# Patient Record
Sex: Male | Born: 1980 | Race: White | Hispanic: No | Marital: Married | Smoking: Never smoker
Health system: Southern US, Community
[De-identification: ages and names within clinical notes are randomized; demographics above are authoritative.]

## PROBLEM LIST (undated history)

## (undated) DIAGNOSIS — K4090 Unilateral inguinal hernia, without obstruction or gangrene, not specified as recurrent: Secondary | ICD-10-CM

## (undated) DIAGNOSIS — T4145XA Adverse effect of unspecified anesthetic, initial encounter: Secondary | ICD-10-CM

---

## 2012-02-08 HISTORY — PX: MULTIPLE TOOTH EXTRACTIONS: SHX2053

## 2012-09-04 ENCOUNTER — Other Ambulatory Visit: Payer: Self-pay | Admitting: Physician Assistant

## 2012-09-04 DIAGNOSIS — R945 Abnormal results of liver function studies: Secondary | ICD-10-CM

## 2012-09-05 ENCOUNTER — Other Ambulatory Visit: Payer: Self-pay | Admitting: Physician Assistant

## 2012-09-05 ENCOUNTER — Ambulatory Visit
Admission: RE | Admit: 2012-09-05 | Discharge: 2012-09-05 | Disposition: A | Discharge status: Home / Self Care | Payer: Self-pay | Source: Ambulatory Visit | Attending: Physician Assistant | Admitting: Physician Assistant

## 2012-09-05 ENCOUNTER — Ambulatory Visit
Admission: RE | Admit: 2012-09-05 | Discharge: 2012-09-05 | Disposition: A | Discharge status: Home / Self Care | Payer: BC Managed Care – PPO | Source: Ambulatory Visit | Attending: Physician Assistant | Admitting: Physician Assistant

## 2012-09-05 ENCOUNTER — Other Ambulatory Visit: Payer: Self-pay | Admitting: Gastroenterology

## 2012-09-05 DIAGNOSIS — R945 Abnormal results of liver function studies: Secondary | ICD-10-CM

## 2012-09-05 MED ORDER — IOHEXOL 350 MG/ML SOLN
125.0000 mL | Freq: Once | INTRAVENOUS | Status: AC | PRN
  Administered 2012-09-05: 100 mL via INTRAVENOUS

## 2012-09-06 ENCOUNTER — Other Ambulatory Visit: Payer: Self-pay | Admitting: Gastroenterology

## 2012-09-06 ENCOUNTER — Encounter (HOSPITAL_COMMUNITY)
Admission: RE | Disposition: A | Discharge status: Home / Self Care | Payer: Self-pay | Source: Ambulatory Visit | Attending: Gastroenterology

## 2012-09-06 ENCOUNTER — Ambulatory Visit (HOSPITAL_COMMUNITY)
Admission: RE | Admit: 2012-09-06 | Discharge: 2012-09-06 | Disposition: A | Discharge status: Home / Self Care | Payer: BC Managed Care – PPO | Source: Ambulatory Visit | Attending: Gastroenterology | Admitting: Gastroenterology

## 2012-09-06 ENCOUNTER — Ambulatory Visit (HOSPITAL_COMMUNITY): Payer: BC Managed Care – PPO | Admitting: Anesthesiology

## 2012-09-06 ENCOUNTER — Ambulatory Visit (HOSPITAL_COMMUNITY): Payer: BC Managed Care – PPO

## 2012-09-06 ENCOUNTER — Encounter (HOSPITAL_COMMUNITY): Payer: Self-pay | Admitting: *Deleted

## 2012-09-06 ENCOUNTER — Encounter (HOSPITAL_COMMUNITY): Payer: Self-pay | Admitting: Anesthesiology

## 2012-09-06 DIAGNOSIS — T8859XA Other complications of anesthesia, initial encounter: Secondary | ICD-10-CM

## 2012-09-06 DIAGNOSIS — K8051 Calculus of bile duct without cholangitis or cholecystitis with obstruction: Secondary | ICD-10-CM | POA: Insufficient documentation

## 2012-09-06 DIAGNOSIS — K8020 Calculus of gallbladder without cholecystitis without obstruction: Secondary | ICD-10-CM | POA: Insufficient documentation

## 2012-09-06 HISTORY — DX: Other complications of anesthesia, initial encounter: T88.59XA

## 2012-09-06 HISTORY — PX: EUS: SHX5427

## 2012-09-06 HISTORY — PX: ERCP: SHX5425

## 2012-09-06 SURGERY — UPPER ENDOSCOPIC ULTRASOUND (EUS) LINEAR
Anesthesia: General

## 2012-09-06 MED ORDER — LIDOCAINE HCL (CARDIAC) 20 MG/ML IV SOLN
INTRAVENOUS | Status: DC | PRN
  Administered 2012-09-06: 100 mg via INTRAVENOUS

## 2012-09-06 MED ORDER — KETAMINE HCL 10 MG/ML IJ SOLN
INTRAMUSCULAR | Status: DC | PRN
  Administered 2012-09-06: 40 mg via INTRAVENOUS

## 2012-09-06 MED ORDER — LACTATED RINGERS IV SOLN
INTRAVENOUS | Status: DC
  Administered 2012-09-06: 1000 mL via INTRAVENOUS

## 2012-09-06 MED ORDER — BUTAMBEN-TETRACAINE-BENZOCAINE 2-2-14 % EX AERO
INHALATION_SPRAY | CUTANEOUS | Status: DC | PRN
  Administered 2012-09-06: 2 via TOPICAL

## 2012-09-06 MED ORDER — CIPROFLOXACIN IN D5W 400 MG/200ML IV SOLN
400.0000 mg | Freq: Once | INTRAVENOUS | Status: AC
  Administered 2012-09-06: 400 mg via INTRAVENOUS

## 2012-09-06 MED ORDER — CIPROFLOXACIN IN D5W 400 MG/200ML IV SOLN
INTRAVENOUS | Status: AC
  Filled 2012-09-06: qty 200

## 2012-09-06 MED ORDER — PROPOFOL 10 MG/ML IV EMUL
INTRAVENOUS | Status: DC | PRN
  Administered 2012-09-06: 160 ug/kg/min via INTRAVENOUS

## 2012-09-06 MED ORDER — ONDANSETRON HCL 4 MG/2ML IJ SOLN
INTRAMUSCULAR | Status: DC | PRN
  Administered 2012-09-06: 4 mg via INTRAVENOUS

## 2012-09-06 MED ORDER — SUCCINYLCHOLINE CHLORIDE 20 MG/ML IJ SOLN
INTRAMUSCULAR | Status: DC | PRN
  Administered 2012-09-06: 100 mg via INTRAVENOUS

## 2012-09-06 MED ORDER — SODIUM CHLORIDE 0.9 % IV SOLN: INTRAVENOUS | Status: DC

## 2012-09-06 MED ORDER — SODIUM CHLORIDE 0.9 % IV SOLN
INTRAVENOUS | Status: DC | PRN
  Administered 2012-09-06: 13:00:00

## 2012-09-06 MED ORDER — FENTANYL CITRATE 0.05 MG/ML IJ SOLN
INTRAMUSCULAR | Status: DC | PRN
  Administered 2012-09-06: 100 ug via INTRAVENOUS

## 2012-09-06 MED ORDER — MIDAZOLAM HCL 5 MG/5ML IJ SOLN
INTRAMUSCULAR | Status: DC | PRN
  Administered 2012-09-06 (×2): 2 mg via INTRAVENOUS

## 2012-09-06 NOTE — Op Note (Signed)
[facility] [address] [place], [address]   ENDOSCOPIC ULTRASOUND PROCEDURE REPORT  PATIENT: Brad, Martin  MR#: [id number] BIRTHDATE: 1981-04-13  GENDER: Male ENDOSCOPIST: Willis Modena, MD REFERRED BY:  Manon Hilding, PA-C Deboraha Sprang) PROCEDURE DATE:  09/06/2012 PROCEDURE:   Endoscopic ultrasound ASA CLASS:      ASA Class-I INDICATIONS:   Obstructive jaundice MEDICATIONS: Cetacaine spray x 2; MAC anesthesia  DESCRIPTION OF PROCEDURE:   After the risks benefits and alternatives of the procedure were  explained, informed consent was obtained. The patient was then placed in the left, lateral, decubitus postion and IV sedation was administered. Throughout the procedure, the patients blood pressure, pulse and oxygen saturations were monitored continuously.  Under direct visualization, the linear echo endoscope was introduced through the mouth  and advanced to the second portion of the duodenum .  Water was used as necessary to provide an acoustic interface.  Upon completion of the imaging, water was removed and the patient was sent to the recovery room in satisfactory condition.   FINDINGS:      Bile duct about 10mm in maximal diameter.  Lots of sludge in gallbladder.  Distal shadowing bile duct stone appreciated.  Ampulla was prominent, but more from impacted stone and not reminiscent of mass lesion.  IMPRESSION:     Sludge in bile duct.  Distal bile duct stone, causing obstructive jaundice.  RECOMMENDATIONS:     1.  Watch for potential complications of procedure. 2.  Proceed with ERCP for biliary sphincterotomy and stone extraction today.   _______________________________ Rosalie DoctorWillis Modena, MD 09/06/2012 1:27 PM   CC:

## 2012-09-06 NOTE — Transfer of Care (Signed)
Immediate Anesthesia Transfer of Care Note  Patient: Brad Martin  Procedure(s) Performed: Procedure(s) (LRB): UPPER ENDOSCOPIC ULTRASOUND (EUS) LINEAR (N/A) ENDOSCOPIC RETROGRADE CHOLANGIOPANCREATOGRAPHY (ERCP) (N/A)  Patient Location: PACU  Anesthesia Type: General  Level of Consciousness: sedated, patient cooperative and responds to stimulaton  Airway & Oxygen Therapy: Patient Spontanous Breathing and Patient connected to face mask oxgen  Post-op Assessment: Report given to PACU RN and Post -op Vital signs reviewed and stable  Post vital signs: Reviewed and stable  Complications: No apparent anesthesia complications

## 2012-09-06 NOTE — Anesthesia Postprocedure Evaluation (Signed)
  Anesthesia Post-op Note  Patient: Brad Martin  Procedure(s) Performed: Procedure(s) (LRB): UPPER ENDOSCOPIC ULTRASOUND (EUS) LINEAR (N/A) ENDOSCOPIC RETROGRADE CHOLANGIOPANCREATOGRAPHY (ERCP) (N/A)  Patient Location: PACU  Anesthesia Type: General  Level of Consciousness: awake and alert   Airway and Oxygen Therapy: Patient Spontanous Breathing  Post-op Pain: mild  Post-op Assessment: Post-op Vital signs reviewed, Patient's Cardiovascular Status Stable, Respiratory Function Stable, Patent Airway and No signs of Nausea or vomiting  Last Vitals:  Filed Vitals:   09/06/12 1329  BP:   Pulse:   Temp: 36.6 C  Resp:     Post-op Vital Signs: stable   Complications: No apparent anesthesia complications

## 2012-09-06 NOTE — Anesthesia Preprocedure Evaluation (Signed)

## 2012-09-06 NOTE — Op Note (Signed)
[facility] [address] [place], [address]   ERCP PROCEDURE REPORT  PATIENT: Brad, Martin  MR# :[id number] BIRTHDATE: 02-24-1981  GENDER: Male ENDOSCOPIST: Willis Modena, MD REFERRED BY: Manon Hilding, PA-C Deboraha Sprang) PROCEDURE DATE:  09/06/2012 PROCEDURE:   ERCP with sphincterotomy/papillotomy and ERCP with removal of calculus/calculi ASA CLASS:    ASA Class I INDICATIONS: obstructive jaundice, gallstones, bile duct stone (seen on EUS) MEDICATIONS:    MAC via CRNA, with subsequent conversion to General Endotracheal Anesthesia; CIPRO 400 mg IV TOPICAL ANESTHETIC:  Cetacaine spray x 2.  DESCRIPTION OF PROCEDURE:   After the risks benefits and alternatives of the procedure were thoroughly explained, informed consent was obtained.  The standard side-viewing duodenoscope endoscope was introduced through the mouth and advanced to the second portion of the duodenum .     FINDINGS:  Patient tolerated MAC anesthesia poorly (respiratory compromise) in midst of procedure, necessitating conversion to general endotracheal anesthesia.  Ampulla bulbous, suggestive of close underlying bile duct stone, as was seen on endoscopic ultrasound.  Bile duct cannulated and deep biliary access obtained. Bile duct prominent, approximately 8mm in diameter.  Distal bile duct filling defect seen.  Biliary sphincterotomy was performed. Large amount of purulent material extruded via ampulla post-sphincterotomy.  Balloon was used to extract a solitary   5mm mulberry-shaped stone; post-occlusion cholangiogram revealed no further biliary filling defects.  Pancreatogram was not obtained (intentionally).  There were no immediate complications.  ENDOSCOPIC IMPRESSION:  Obstructive jaundice due to choledocholithiasis.  Biliary sphincterotomy performed, followed by bile duct stone extraction.  RECOMMENDATIONS: 1.  Watch for potential complications of procedure. 2.  Follow clinical and  biochemical response to bile duct stone extraction. 3.  Expedited outpatient surgical evaluation for consideration of cholecystectomy.     _______________________________ Rosalie DoctorWillis Modena, MD 09/06/2012 1:23 PM   CC:

## 2012-09-06 NOTE — H&P (Signed)
Patient interval history reviewed.  Patient examined again.  There has been no change from documented H/P dated 09/05/12 (scanned into chart from our office) except as documented above.  Assessment:  1.  Obstructive jaundice. 2.  Gallstones.  Plan:  1.  Endoscopic ultrasound with possible fine needle aspiration (FNA) biopsies. 2.  Risks (bleeding, infection, bowel perforation that could require surgery, sedation-related changes in cardiopulmonary systems), benefits (identification and possible treatment of source of symptoms, exclusion of certain causes of symptoms), and alternatives (watchful waiting, radiographic imaging studies, empiric medical treatment) of upper endoscopy with ultrasound and possible fine needle aspiration (EUS +/- FNA) were explained to patient/family in detail and patient wishes to proceed. 3.  Endoscopic retrograde cholangiopancreatography with possible biliary sphincterotomy, stone extraction, stent placement. 4.  Risks (up to and including bleeding, infection, perforation, pancreatitis that can be complicated by infected necrosis and death), benefits (removal of stones, alleviating blockage, decreasing risk of cholangitis or choledocholithiasis-related pancreatitis), and alternatives (watchful waiting, percutaneous transhepatic cholangiography) of ERCP were explained to patient/family in detail and patient elects to proceed.

## 2012-09-07 ENCOUNTER — Encounter (HOSPITAL_COMMUNITY): Payer: Self-pay | Admitting: Gastroenterology

## 2012-09-12 NOTE — Addendum Note (Signed)
Addended by: Willis Modena on: 09/12/2012 09:22 AM   Modules accepted: Orders

## 2012-09-18 ENCOUNTER — Encounter (INDEPENDENT_AMBULATORY_CARE_PROVIDER_SITE_OTHER): Payer: Self-pay | Admitting: General Surgery

## 2012-09-20 ENCOUNTER — Ambulatory Visit (INDEPENDENT_AMBULATORY_CARE_PROVIDER_SITE_OTHER): Payer: Self-pay | Admitting: General Surgery

## 2012-09-22 ENCOUNTER — Encounter (HOSPITAL_COMMUNITY): Payer: Self-pay | Admitting: Pharmacy Technician

## 2012-09-22 ENCOUNTER — Encounter (INDEPENDENT_AMBULATORY_CARE_PROVIDER_SITE_OTHER): Payer: Self-pay | Admitting: General Surgery

## 2012-09-22 ENCOUNTER — Ambulatory Visit (INDEPENDENT_AMBULATORY_CARE_PROVIDER_SITE_OTHER): Payer: BC Managed Care – PPO | Admitting: General Surgery

## 2012-09-22 ENCOUNTER — Encounter (HOSPITAL_COMMUNITY): Payer: Self-pay | Admitting: *Deleted

## 2012-09-22 VITALS — BP 104/66 | HR 60 | Temp 97.6°F | Resp 14 | Ht 73.0 in | Wt 209.2 lb

## 2012-09-22 DIAGNOSIS — K8070 Calculus of gallbladder and bile duct without cholecystitis without obstruction: Secondary | ICD-10-CM | POA: Insufficient documentation

## 2012-09-22 DIAGNOSIS — R7989 Other specified abnormal findings of blood chemistry: Secondary | ICD-10-CM

## 2012-09-22 MED ORDER — AMOXICILLIN-POT CLAVULANATE 875-125 MG PO TABS: 1.0000 | ORAL_TABLET | Freq: Two times a day (BID) | ORAL | Status: AC

## 2012-09-22 NOTE — Progress Notes (Signed)
Patient ID: Brad Martin, male   DOB: [birth year], 32 y.o.   MRN: [id number]  No chief complaint on file.   HPI Brad Martin is a 32 y.o. male.   HPI 32 year old Caucasian male referred by Dr. Odelia Gage for consideration for cholecystectomy. The patient states his symptoms started about a week before Easter. He states that he had about 7-10 days of back pain but didn't think much of it. On Easter he was at work and became very nauseous, dizzy and began vomiting. He ended up seeing a primary care physician at [facility] and was found to have elevated LFTs. This prompted an ultrasound as well as a CT scan as well as referral to Dr. Dulce Sellar. He appeared to have cholelithiasis as well as choledocholithiasis. He underwent EUS and ERCP on April 30. A sphincterotomy  Was performed and the stone was extracted. Since then the patient has had ongoing jaundice, yellowing of the eyes, and acholic stools. This has been going on since the symptoms started around Easter. He denies any abdominal pain. He states that he has lost his appetite. He feels gassy and bloated if he eats something greasy or fried. He has modified his diet. He states that his stools are quite. He denies any foreign travel. He denies  New medication. He denies using NSAIDs. He denies any family history of cancer. He drinks alcohol on an occasional basis. He denies any drugs.  He is married and states that he is having a difficult time in his marriage.  He had repeat LFTs drawn yesterday which revealed an even higher bilirubin and the plan is for him to have a repeat ERCP next Wednesday on May 21 Past Medical History  Diagnosis Date  . GERD (gastroesophageal reflux disease)     rarely  . Jaundice Spring 2014    Past Surgical History  Procedure Laterality Date  . Multiple tooth extractions  Mid October 2013    infection in jaw, 2 teeth removed  . Eus N/A 09/06/2012    Procedure: UPPER ENDOSCOPIC ULTRASOUND (EUS) LINEAR;  Surgeon: Willis Modena, MD;  Location: [facility] ENDOSCOPY;  Service: Endoscopy;  Laterality: N/A;  . Ercp N/A 09/06/2012    Procedure: ENDOSCOPIC RETROGRADE CHOLANGIOPANCREATOGRAPHY (ERCP);  Surgeon: Willis Modena, MD;  Location: [facility];  Service: Endoscopy;  Laterality: N/A;    Family History  Problem Relation Age of Onset  . Stroke Mother     Social History History  Substance Use Topics  . Smoking status: Never Smoker   . Smokeless tobacco: Not on file  . Alcohol Use: Yes     Comment: rare    Allergies  Allergen Reactions  . Benadryl (Diphenhydramine Hcl) Other (See Comments)    Hallucinations  . Other Other (See Comments)    Benzodine-Hallocinations    Current Outpatient Prescriptions  Medication Sig Dispense Refill  . acetaminophen (TYLENOL) 325 MG tablet Take 650 mg by mouth every 6 (six) hours as needed for pain.       Marland Kitchen amoxicillin-clavulanate (AUGMENTIN) 875-125 MG per tablet Take 1 tablet by mouth 2 (two) times daily.  12 tablet  0   No current facility-administered medications for this visit.    Review of Systems Review of Systems  Constitutional: Positive for appetite change and unexpected weight change. Negative for fever and chills.  HENT: Negative for congestion, trouble swallowing and neck stiffness.   Eyes: Negative for visual disturbance.  Respiratory: Negative for chest tightness and shortness of breath.   Cardiovascular: Negative for  chest pain and leg swelling.       No PND, no orthopnea, no DOE  Gastrointestinal:       See HPI  Genitourinary: Negative for dysuria and hematuria.  Musculoskeletal: Negative.   Skin: Negative for rash.  Neurological: Negative for seizures and speech difficulty.  Hematological: Does not bruise/bleed easily.  Psychiatric/Behavioral: Negative for behavioral problems and confusion.    Blood pressure 104/66, pulse 60, temperature 97.6 F (36.4 C), resp. rate 14, height 6\' 1"  (1.854 m), weight 209 lb 3.2 oz (94.892 kg).  Physical  Exam Physical Exam  Vitals reviewed. Constitutional: He is oriented to person, place, and time. He appears well-developed and well-nourished. No distress.  HENT:  Head: Normocephalic and atraumatic.  Right Ear: External ear normal.  Left Ear: External ear normal.  Eyes: Conjunctivae are normal. Scleral icterus is present.  Neck: Normal range of motion. Neck supple. No tracheal deviation present.  Cardiovascular: Normal rate, regular rhythm and normal heart sounds.   Pulmonary/Chest: Effort normal. No respiratory distress. He has no wheezes.  Abdominal: Soft. He exhibits no distension. There is no tenderness. There is no rebound and no guarding.  Musculoskeletal: He exhibits no edema and no tenderness.  Lymphadenopathy:    He has no cervical adenopathy.  Neurological: He is alert and oriented to person, place, and time.  Skin: Skin is warm and dry. He is not diaphoretic.  jaundice  Psychiatric: He has a normal mood and affect. His behavior is normal. Judgment and thought content normal.    Data Reviewed COMPLETE ABDOMINAL ULTRASOUND  Comparison: None.  Findings:  Gallbladder: There is an 18 mm stone and possibly other small  stones in the gallbladder. The stones are mobile. No gallbladder  wall thickening. Negative sonographic Murphy's sign.  Common bile duct: There is dilatation of the intra and  extrahepatic bile ducts. Common bile duct is 9 mm in diameter.  The distal common bile duct is not well seen. The possibility of a  mass or stone in the duct should be considered.  Liver: Intrahepatic ductal dilatation. Otherwise, normal.  IVC: Normal.  Pancreas: Obscured by bowel gas.  Spleen: Normal. 9.0 cm in length.  Right Kidney: Normal. 12.5 cm in length.  Left Kidney: Normal. 12.3 cm in length.  Abdominal aorta: Normal. 2.2 cm in diameter.  IMPRESSION: Dilated intra and extrahepatic bile ducts consistent  with distal common bile duct obstruction. The distal duct and head  of  the pancreas are not visible due to bowel gas. Cholelithiasis.  CT ABDOMEN WITHOUT AND WITH CONTRAST  Technique: Multidetector CT imaging of the abdomen was performed  following the standard protocol before and during bolus  administration of intravenous contrast.  Contrast: 100 ml Omnipaque  Comparison: Ultrasound 09/05/2012  Findings: Lung bases are clear. No pericardial fluid.  There is intrahepatic and extrahepatic biliary duct dilatation with  the common bile duct is dilated to 11 mm. There is no enhancing  hepatic lesion. There is a gallstone within the neck of the  gallbladder measuring 12 mm. Mild gallbladder wall thickening.  There is no radio dense gallstone within the common bile duct.  There is prominence of the ampulla with nodular extension into the  duodenum measuring 16 x 13 mm best seen on image 31, series 3. The  pancreatic duct is not dilated. The pancreas parenchyma appears  normal. A peripancreatic inflammation to suggest acute  pancreatitis.  The spleen, adrenal glands, and kidneys are normal.  The stomach, duodenum, and small bowel  and limited view the colon  unremarkable. No retroperitoneal lymphadenopathy  IMPRESSION:  .  1. Intrahepatic biliary ductal dilatation and common bile duct  dilatation consistent with distal obstruction. No radiodense stone  is present in the common bile duct. Cannot exclude a radiolucent  stone.  2. Prominence of the ampulla which could represent edema or a true  lesion. Recommend ERCP for further evaluation and relief of  obstruction.  3. Cholelithiasis.  4. No evidence of acute pancreatitis.  EUS & ERCP & sphincterotomy note 4/30 Bile duct about 10 mm in maximal diameter. Lots of sludge in gallbladder. Distal shadowing bile duct stone appreciated. Ampulla was prominent, but more from impacted stone and not reminiscent of mass lesion  Bile duct was prominent, approximately 8 mm in diameter. Distal bile duct filling defect  seen. Biliary sphincterotomy performed. Large amount of purulent material extruded the ampulla post sphincterotomy. Balloon was used to extract a solitary 5 mm stone; post occlusion cholangiogram revealed no further biliary filling defects  Dr. Dulce Sellar note 4/29 Labs April 25: White blood cell count 6.5, total bilirubin 4.9, direct bilirubin 2.8, indirect bilirubin 2.2, AST 45, ALT 144, alkaline phosphatase 144 labs May 15: Total bilirubin 10.8, direct bilirubin 5.8, indirect bilirubin 5, AST 109, ALT 52, alkaline phosphatase 87, albumin 4  Assessment    Cholelithiasis Choledocholithiasis status post ERCP and sphincterotomy Jaundice Elevated LFTs     Plan    The patient still has elevated LFTs s/p ERCP with sphincterotomy. It is unclear whether or not he has another common bile duct stone or a common bile duct stricture. Hopefully the situation will be resolved with his repeat ERCP. My suspicion for malignancy is low. I have recommended cholecystectomy after his ERCP next week. I explained to the patient that  If there is a atypical finding on his ERCP then we may not be able to proceed with surgery as planned.  Currently we have the patient scheduled for laparoscopic cholecystectomy next Friday. I explained to the patient that if he develops post ERCP pancreatitis and or if an atypical finding is discovered during his ERCP then that may change plans for surgery.  Hopefully he just has some edema or inflammation from the sphincterotomy.   Because his LFTs are still elevated I placed the patient on prophylactic Augmentin therapy.  I advised the patient that if he develops fever, chills, persistent nausea or vomiting, or abdominal pain or worsening back pain then he needs to go to the emergency department.  I believe the patient's symptoms are consistent with gallbladder disease.  I discussed laparoscopic cholecystectomy with IOC in detail.  The patient was given educational material as well as  diagrams detailing the procedure.  We discussed the risks and benefits of a laparoscopic cholecystectomy including, but not limited to bleeding, infection, injury to surrounding structures such as the intestine or liver, bile leak, retained gallstones, need to convert to an open procedure, prolonged diarrhea, blood clots such as  DVT, common bile duct injury, anesthesia risks, and possible need for additional procedures.  We discussed the typical post-operative recovery course. I explained that the likelihood of improvement of their symptoms is good.   [name]. Andrey Campanile, MD, FACS General, Bariatric, & Minimally Invasive Surgery [facility], [place]        [facility] M 09/22/2012, 2:11 PM

## 2012-09-22 NOTE — Patient Instructions (Signed)
If you develop fever or ear than 101, persistent nausea vomiting, worsening pain you need to go to the emergency department. Pick up your antibiotic and take as prescribed If there are no issues during your ERCP on Wednesday then we will proceed with laparoscopic cholecystectomy on Friday

## 2012-09-25 ENCOUNTER — Ambulatory Visit (HOSPITAL_COMMUNITY)
Admission: RE | Admit: 2012-09-25 | Discharge: 2012-09-25 | Disposition: A | Discharge status: Home / Self Care | Payer: BC Managed Care – PPO | Source: Ambulatory Visit | Attending: Gastroenterology | Admitting: Gastroenterology

## 2012-09-25 ENCOUNTER — Other Ambulatory Visit (HOSPITAL_COMMUNITY): Payer: Self-pay | Admitting: Gastroenterology

## 2012-09-25 DIAGNOSIS — K8689 Other specified diseases of pancreas: Secondary | ICD-10-CM

## 2012-09-25 DIAGNOSIS — R17 Unspecified jaundice: Secondary | ICD-10-CM | POA: Insufficient documentation

## 2012-09-25 DIAGNOSIS — K8020 Calculus of gallbladder without cholecystitis without obstruction: Secondary | ICD-10-CM | POA: Insufficient documentation

## 2012-09-25 MED ORDER — GADOBENATE DIMEGLUMINE 529 MG/ML IV SOLN
20.0000 mL | Freq: Once | INTRAVENOUS | Status: AC | PRN
  Administered 2012-09-25: 20 mL via INTRAVENOUS

## 2012-09-27 ENCOUNTER — Encounter (INDEPENDENT_AMBULATORY_CARE_PROVIDER_SITE_OTHER): Payer: Self-pay

## 2012-09-27 ENCOUNTER — Telehealth (INDEPENDENT_AMBULATORY_CARE_PROVIDER_SITE_OTHER): Payer: Self-pay | Admitting: General Surgery

## 2012-09-27 ENCOUNTER — Ambulatory Visit (HOSPITAL_COMMUNITY)
Admission: RE | Admit: 2012-09-27 | Payer: BC Managed Care – PPO | Source: Ambulatory Visit | Admitting: Gastroenterology

## 2012-09-27 HISTORY — DX: Adverse effect of unspecified anesthetic, initial encounter: T41.45XA

## 2012-09-27 SURGERY — ERCP, WITH INTERVENTION IF INDICATED
Anesthesia: General

## 2012-09-27 NOTE — Telephone Encounter (Signed)
Patient  Returned my phone call. We discussed his MRCP results which showed no evidence of choledocholithiasis or bile duct dilatation. Explained that his total bilirubin is still elevated at around 8 however his transaminases and alkaline phosphatase are essentially normal. Explained that he still has cholelithiasis demonstrated on MRI. I recommended cholecystectomy with cholangiogram in the future; however, I explained that I could not guarantee that cholecystectomy would fix his elevated bilirubin. He is in the process of having that evaluated. We discussed proceeding with cholecystectomy this Friday or delaying it until he has had more of a workup of his elevated bilirubin. The patient would like to delay surgery until all of his lab work is completed for his elevated bilirubin. I explained that I have seen  where the MRI has been falsely negative. However I think is okay waiting another week or 2 before proceeding with cholecystectomy. We may have to do a liver biopsy at that time. The patient has been rescheduled for gallbladder surgery for Monday, June 9. Our scheduler  will contact him with the new operative date

## 2012-09-27 NOTE — Telephone Encounter (Signed)
Left message on patient's voicemail. Explained that I would contact him tomorrow to discuss his recent MRCP, recent labs and whether or not he wanted to proceed with cholecystectomy on Friday.

## 2012-10-03 ENCOUNTER — Encounter (INDEPENDENT_AMBULATORY_CARE_PROVIDER_SITE_OTHER): Payer: Self-pay

## 2012-10-10 ENCOUNTER — Encounter (HOSPITAL_COMMUNITY)
Admission: RE | Admit: 2012-10-10 | Discharge: 2012-10-10 | Disposition: A | Discharge status: Home / Self Care | Payer: BC Managed Care – PPO | Source: Ambulatory Visit | Attending: General Surgery | Admitting: General Surgery

## 2012-10-10 ENCOUNTER — Encounter (HOSPITAL_COMMUNITY): Payer: Self-pay

## 2012-10-10 LAB — COMPREHENSIVE METABOLIC PANEL
Albumin: 3.7 g/dL (ref 3.5–5.2)
Alkaline Phosphatase: 77 U/L (ref 39–117)
BUN: 14 mg/dL (ref 6–23)
CO2: 28 mEq/L (ref 19–32)
Chloride: 103 mEq/L (ref 96–112)
GFR calc Af Amer: >90 mL/min (ref >90)
GFR calc non Af Amer: >90 mL/min (ref >90)
Glucose, Bld: 121 mg/dL — ABNORMAL HIGH (ref 70–99)
Potassium: 3.8 mEq/L (ref 3.5–5.1)
Total Bilirubin: 2.5 mg/dL — ABNORMAL HIGH (ref 0.3–1.2)

## 2012-10-10 LAB — CBC WITH DIFFERENTIAL/PLATELET
HCT: 38.4 % — ABNORMAL LOW (ref 39.0–52.0)
Hemoglobin: 13.7 g/dL (ref 13.0–17.0)
Lymphs Abs: 3.4 10*3/uL (ref 0.7–4.0)
Monocytes Absolute: 0.5 10*3/uL (ref 0.1–1.0)
Monocytes Relative: 6 % (ref 3–12)
Neutro Abs: 4.2 10*3/uL (ref 1.7–7.7)
Neutrophils Relative %: 50 % (ref 43–77)
RBC: 4.48 MIL/uL (ref 4.22–5.81)

## 2012-10-10 LAB — LIPASE, BLOOD: Lipase: 28 U/L (ref 11–59)

## 2012-10-10 NOTE — Progress Notes (Signed)
Per A. Zelenak,PAC, will order clotting studies if pt hepatic screen indicates it.  Awaiting results. Of CMET

## 2012-10-10 NOTE — Progress Notes (Signed)
Call to A. Zelenak,PAC, relative to pt. Report of anesth. Experience during ERCP, becoming combative & needing to "change anesth. From MAC to tracheal".

## 2012-10-10 NOTE — Pre-Procedure Instructions (Signed)
Brad Martin  10/10/2012   Your procedure is scheduled on:  10/16/2012  Report to [facility] at 5:30 AM.  Call this number if you have problems the morning of surgery: [phone]   Remember:   Do not eat food or drink liquids after midnight.   SUNDAY   Take these medicines the morning of surgery with A SIP OF WATER:  NONE   Do not wear jewelry, make-up or nail polish.  Do not wear lotions, powders, or perfumes. You may wear deodorant.  Do not shave 48 hours prior to surgery. Men may shave face and neck.  Do not bring valuables to the hospital.  [facility] is not responsible                   for any belongings or valuables.  Contacts, dentures or bridgework may not be worn into surgery.  Leave suitcase in the car. After surgery it may be brought to your room.  For patients admitted to the hospital, checkout time is 11:00 AM the day of  discharge.   Patients discharged the day of surgery will not be allowed to drive  home.  Name and phone number of your driver: /w family  Special Instructions: Shower using CHG 2 nights before surgery and the night before surgery.  If you shower the day of surgery use CHG.  Use special wash - you have one bottle of CHG for all showers.  You should use approximately 1/3 of the bottle for each shower.   Please read over the following fact sheets that you were given: Pain Booklet, Coughing and Deep Breathing, MRSA Information and Surgical Site Infection Prevention

## 2012-10-13 ENCOUNTER — Telehealth (INDEPENDENT_AMBULATORY_CARE_PROVIDER_SITE_OTHER): Payer: Self-pay | Admitting: General Surgery

## 2012-10-13 NOTE — Telephone Encounter (Signed)
Called pt to check on and discuss most recent LFTs. LFTs almost back to normal. Tbili down to 2.5 from 10. AST/ALT almost normal. Pt feeling better. Only with abd pain if eats too much. No f/c. Less jaundice. Will proceed with Lap chole with ioc, poss liver bx on Monday. Questions answered.

## 2012-10-15 MED ORDER — DEXTROSE 5 % IV SOLN
2.0000 g | INTRAVENOUS | Status: AC
  Administered 2012-10-16: 2 g via INTRAVENOUS
  Filled 2012-10-15: qty 2

## 2012-10-16 ENCOUNTER — Ambulatory Visit (HOSPITAL_COMMUNITY): Payer: BC Managed Care – PPO | Admitting: Anesthesiology

## 2012-10-16 ENCOUNTER — Encounter (HOSPITAL_COMMUNITY)
Admission: RE | Disposition: A | Discharge status: Home / Self Care | Payer: Self-pay | Source: Ambulatory Visit | Attending: General Surgery

## 2012-10-16 ENCOUNTER — Encounter (HOSPITAL_COMMUNITY): Payer: Self-pay | Admitting: *Deleted

## 2012-10-16 ENCOUNTER — Ambulatory Visit (HOSPITAL_COMMUNITY): Payer: BC Managed Care – PPO

## 2012-10-16 ENCOUNTER — Encounter (HOSPITAL_COMMUNITY): Payer: Self-pay | Admitting: Vascular Surgery

## 2012-10-16 ENCOUNTER — Ambulatory Visit (HOSPITAL_COMMUNITY)
Admission: RE | Admit: 2012-10-16 | Discharge: 2012-10-16 | Disposition: A | Discharge status: Home / Self Care | Payer: BC Managed Care – PPO | Source: Ambulatory Visit | Attending: General Surgery | Admitting: General Surgery

## 2012-10-16 DIAGNOSIS — Z01812 Encounter for preprocedural laboratory examination: Secondary | ICD-10-CM | POA: Insufficient documentation

## 2012-10-16 DIAGNOSIS — K8010 Calculus of gallbladder with chronic cholecystitis without obstruction: Secondary | ICD-10-CM

## 2012-10-16 DIAGNOSIS — R17 Unspecified jaundice: Secondary | ICD-10-CM | POA: Insufficient documentation

## 2012-10-16 HISTORY — PX: CHOLECYSTECTOMY: SHX55

## 2012-10-16 LAB — PROTIME-INR: Prothrombin Time: 13.3 seconds (ref 11.6–15.2)

## 2012-10-16 SURGERY — LAPAROSCOPIC CHOLECYSTECTOMY WITH INTRAOPERATIVE CHOLANGIOGRAM
Anesthesia: General | Wound class: Clean Contaminated

## 2012-10-16 MED ORDER — ONDANSETRON HCL 4 MG/2ML IJ SOLN: 4.0000 mg | Freq: Once | INTRAMUSCULAR | Status: DC | PRN

## 2012-10-16 MED ORDER — HYDROMORPHONE HCL PF 1 MG/ML IJ SOLN
0.2500 mg | INTRAMUSCULAR | Status: DC | PRN
  Administered 2012-10-16 (×2): 0.5 mg via INTRAVENOUS

## 2012-10-16 MED ORDER — BUPIVACAINE-EPINEPHRINE 0.25% -1:200000 IJ SOLN
INTRAMUSCULAR | Status: DC | PRN
  Administered 2012-10-16: 30 mL

## 2012-10-16 MED ORDER — ROCURONIUM BROMIDE 100 MG/10ML IV SOLN
INTRAVENOUS | Status: DC | PRN
  Administered 2012-10-16: 20 mg via INTRAVENOUS
  Administered 2012-10-16: 10 mg via INTRAVENOUS
  Administered 2012-10-16: 30 mg via INTRAVENOUS

## 2012-10-16 MED ORDER — SODIUM CHLORIDE 0.9 % IR SOLN
Status: DC | PRN
  Administered 2012-10-16: 1000 mL

## 2012-10-16 MED ORDER — ONDANSETRON HCL 4 MG/2ML IJ SOLN
INTRAMUSCULAR | Status: DC | PRN
  Administered 2012-10-16: 4 mg via INTRAVENOUS

## 2012-10-16 MED ORDER — PROMETHAZINE HCL 25 MG/ML IJ SOLN: 6.2500 mg | INTRAMUSCULAR | Status: DC | PRN

## 2012-10-16 MED ORDER — GLYCOPYRROLATE 0.2 MG/ML IJ SOLN
INTRAMUSCULAR | Status: DC | PRN
  Administered 2012-10-16: .8 mg via INTRAVENOUS
  Administered 2012-10-16 (×2): 0.2 mg via INTRAVENOUS

## 2012-10-16 MED ORDER — SODIUM CHLORIDE 0.9 % IV SOLN
INTRAVENOUS | Status: DC | PRN
  Administered 2012-10-16: 08:00:00

## 2012-10-16 MED ORDER — SODIUM CHLORIDE 0.9 % IV SOLN: 250.0000 mL | INTRAVENOUS | Status: DC | PRN

## 2012-10-16 MED ORDER — HYDROMORPHONE HCL PF 1 MG/ML IJ SOLN
INTRAMUSCULAR | Status: AC
  Filled 2012-10-16: qty 1

## 2012-10-16 MED ORDER — LIDOCAINE HCL (CARDIAC) 20 MG/ML IV SOLN
INTRAVENOUS | Status: DC | PRN
  Administered 2012-10-16: 60 mg via INTRAVENOUS

## 2012-10-16 MED ORDER — POTASSIUM CHLORIDE IN NACL 20-0.9 MEQ/L-% IV SOLN
INTRAVENOUS | Status: DC
  Filled 2012-10-16 (×2): qty 1000

## 2012-10-16 MED ORDER — OXYCODONE-ACETAMINOPHEN 5-325 MG PO TABS: 1.0000 | ORAL_TABLET | ORAL | Status: DC | PRN

## 2012-10-16 MED ORDER — ONDANSETRON HCL 4 MG/2ML IJ SOLN
4.0000 mg | Freq: Four times a day (QID) | INTRAMUSCULAR | Status: DC | PRN
  Filled 2012-10-16: qty 2

## 2012-10-16 MED ORDER — FENTANYL CITRATE 0.05 MG/ML IJ SOLN
INTRAMUSCULAR | Status: DC | PRN
  Administered 2012-10-16: 50 ug via INTRAVENOUS
  Administered 2012-10-16: 25 ug via INTRAVENOUS
  Administered 2012-10-16: 50 ug via INTRAVENOUS
  Administered 2012-10-16: 100 ug via INTRAVENOUS
  Administered 2012-10-16: 25 ug via INTRAVENOUS

## 2012-10-16 MED ORDER — SODIUM CHLORIDE 0.9 % IJ SOLN: 3.0000 mL | INTRAMUSCULAR | Status: DC | PRN

## 2012-10-16 MED ORDER — HEMOSTATIC AGENTS (NO CHARGE) OPTIME
TOPICAL | Status: DC | PRN
  Administered 2012-10-16: 1 via TOPICAL

## 2012-10-16 MED ORDER — ACETAMINOPHEN 650 MG RE SUPP
650.0000 mg | RECTAL | Status: DC | PRN
  Filled 2012-10-16: qty 1

## 2012-10-16 MED ORDER — LACTATED RINGERS IV SOLN
INTRAVENOUS | Status: DC | PRN
  Administered 2012-10-16 (×2): via INTRAVENOUS

## 2012-10-16 MED ORDER — NEOSTIGMINE METHYLSULFATE 1 MG/ML IJ SOLN
INTRAMUSCULAR | Status: DC | PRN
  Administered 2012-10-16: 5 mg via INTRAVENOUS

## 2012-10-16 MED ORDER — PROPOFOL 10 MG/ML IV BOLUS
INTRAVENOUS | Status: DC | PRN
  Administered 2012-10-16: 200 mg via INTRAVENOUS

## 2012-10-16 MED ORDER — 0.9 % SODIUM CHLORIDE (POUR BTL) OPTIME
TOPICAL | Status: DC | PRN
  Administered 2012-10-16: 1000 mL

## 2012-10-16 MED ORDER — ARTIFICIAL TEARS OP OINT
TOPICAL_OINTMENT | OPHTHALMIC | Status: DC | PRN
  Administered 2012-10-16: 1 via OPHTHALMIC

## 2012-10-16 MED ORDER — LIDOCAINE HCL 4 % MT SOLN
OROMUCOSAL | Status: DC | PRN
  Administered 2012-10-16: 4 mL via TOPICAL

## 2012-10-16 MED ORDER — OXYCODONE HCL 5 MG PO TABS
5.0000 mg | ORAL_TABLET | ORAL | Status: DC | PRN
  Administered 2012-10-16: 10 mg via ORAL

## 2012-10-16 MED ORDER — MORPHINE SULFATE 2 MG/ML IJ SOLN: 1.0000 mg | INTRAMUSCULAR | Status: DC | PRN

## 2012-10-16 MED ORDER — BUPIVACAINE-EPINEPHRINE PF 0.25-1:200000 % IJ SOLN
INTRAMUSCULAR | Status: AC
  Filled 2012-10-16: qty 30

## 2012-10-16 MED ORDER — OXYCODONE HCL 5 MG PO TABS
ORAL_TABLET | ORAL | Status: AC
  Filled 2012-10-16: qty 2

## 2012-10-16 MED ORDER — SODIUM CHLORIDE 0.9 % IJ SOLN: 3.0000 mL | Freq: Two times a day (BID) | INTRAMUSCULAR | Status: DC

## 2012-10-16 MED ORDER — ACETAMINOPHEN 325 MG PO TABS
650.0000 mg | ORAL_TABLET | ORAL | Status: DC | PRN
  Filled 2012-10-16: qty 2

## 2012-10-16 SURGICAL SUPPLY — 50 items
APPLIER CLIP 5 13 M/L LIGAMAX5 (MISCELLANEOUS) ×2
BANDAGE ADHESIVE 1X3 (GAUZE/BANDAGES/DRESSINGS) ×6 IMPLANT
BENZOIN TINCTURE PRP APPL 2/3 (GAUZE/BANDAGES/DRESSINGS) ×2 IMPLANT
BLADE SURG ROTATE 9660 (MISCELLANEOUS) ×2 IMPLANT
CANISTER SUCTION 2500CC (MISCELLANEOUS) ×2 IMPLANT
CHLORAPREP W/TINT 26ML (MISCELLANEOUS) ×2 IMPLANT
CLIP APPLIE 5 13 M/L LIGAMAX5 (MISCELLANEOUS) ×1 IMPLANT
CLOTH BEACON ORANGE TIMEOUT ST (SAFETY) ×2 IMPLANT
COVER MAYO STAND STRL (DRAPES) ×2 IMPLANT
COVER SURGICAL LIGHT HANDLE (MISCELLANEOUS) ×2 IMPLANT
DECANTER SPIKE VIAL GLASS SM (MISCELLANEOUS) ×2 IMPLANT
DRAPE C-ARM 42X72 X-RAY (DRAPES) ×2 IMPLANT
DRAPE UTILITY 15X26 W/TAPE STR (DRAPE) ×4 IMPLANT
DRSG TEGADERM 4X4.75 (GAUZE/BANDAGES/DRESSINGS) ×2 IMPLANT
ELECT REM PT RETURN 9FT ADLT (ELECTROSURGICAL) ×2
ELECTRODE REM PT RTRN 9FT ADLT (ELECTROSURGICAL) ×1 IMPLANT
ENDOLOOP SUT PDS II  0 18 (SUTURE) ×2
ENDOLOOP SUT PDS II 0 18 (SUTURE) ×2 IMPLANT
GAUZE SPONGE 2X2 8PLY STRL LF (GAUZE/BANDAGES/DRESSINGS) ×1 IMPLANT
GLOVE BIOGEL M STRL SZ7.5 (GLOVE) ×2 IMPLANT
GLOVE BIOGEL PI IND STRL 7.0 (GLOVE) ×2 IMPLANT
GLOVE BIOGEL PI IND STRL 7.5 (GLOVE) ×1 IMPLANT
GLOVE BIOGEL PI IND STRL 8 (GLOVE) ×2 IMPLANT
GLOVE BIOGEL PI INDICATOR 7.0 (GLOVE) ×2
GLOVE BIOGEL PI INDICATOR 7.5 (GLOVE) ×1
GLOVE BIOGEL PI INDICATOR 8 (GLOVE) ×2
GLOVE ECLIPSE 7.5 STRL STRAW (GLOVE) ×2 IMPLANT
GLOVE SS BIOGEL STRL SZ 7.5 (GLOVE) ×1 IMPLANT
GLOVE SUPERSENSE BIOGEL SZ 7.5 (GLOVE) ×1
GOWN PREVENTION PLUS XLARGE (GOWN DISPOSABLE) ×4 IMPLANT
GOWN STRL NON-REIN LRG LVL3 (GOWN DISPOSABLE) ×4 IMPLANT
HEMOSTAT SNOW SURGICEL 2X4 (HEMOSTASIS) ×2 IMPLANT
KIT BASIN OR (CUSTOM PROCEDURE TRAY) ×2 IMPLANT
KIT ROOM TURNOVER OR (KITS) ×2 IMPLANT
NS IRRIG 1000ML POUR BTL (IV SOLUTION) ×2 IMPLANT
PAD ARMBOARD 7.5X6 YLW CONV (MISCELLANEOUS) ×2 IMPLANT
POUCH SPECIMEN RETRIEVAL 10MM (ENDOMECHANICALS) ×2 IMPLANT
SCISSORS LAP 5X35 DISP (ENDOMECHANICALS) IMPLANT
SET CHOLANGIOGRAPH 5 50 .035 (SET/KITS/TRAYS/PACK) ×2 IMPLANT
SET IRRIG TUBING LAPAROSCOPIC (IRRIGATION / IRRIGATOR) ×2 IMPLANT
SLEEVE ENDOPATH XCEL 5M (ENDOMECHANICALS) ×4 IMPLANT
SPECIMEN JAR SMALL (MISCELLANEOUS) ×2 IMPLANT
SPONGE GAUZE 2X2 STER 10/PKG (GAUZE/BANDAGES/DRESSINGS) ×1
SUT MNCRL AB 4-0 PS2 18 (SUTURE) ×4 IMPLANT
SUT VICRYL 0 UR6 27IN ABS (SUTURE) ×2 IMPLANT
TOWEL OR 17X24 6PK STRL BLUE (TOWEL DISPOSABLE) ×2 IMPLANT
TOWEL OR 17X26 10 PK STRL BLUE (TOWEL DISPOSABLE) ×2 IMPLANT
TRAY LAPAROSCOPIC (CUSTOM PROCEDURE TRAY) ×2 IMPLANT
TROCAR XCEL BLUNT TIP 100MML (ENDOMECHANICALS) ×2 IMPLANT
TROCAR XCEL NON-BLD 5MMX100MML (ENDOMECHANICALS) ×2 IMPLANT

## 2012-10-16 NOTE — Anesthesia Procedure Notes (Signed)
Procedure Name: Intubation Date/Time: 10/16/2012 7:36 AM Performed by: Jefm Miles E Pre-anesthesia Checklist: Patient identified, Timeout performed, Emergency Drugs available, Suction available and Patient being monitored Patient Re-evaluated:Patient Re-evaluated prior to inductionOxygen Delivery Method: Circle system utilized Preoxygenation: Pre-oxygenation with 100% oxygen Intubation Type: IV induction Ventilation: Mask ventilation without difficulty and Oral airway inserted - appropriate to patient size Laryngoscope Size: Mac and 3 Grade View: Grade I Tube type: Oral Tube size: 7.5 mm Number of attempts: 1 Airway Equipment and Method: Stylet and LTA kit utilized Placement Confirmation: ETT inserted through vocal cords under direct vision,  breath sounds checked- equal and bilateral and positive ETCO2 Secured at: 23 cm Tube secured with: Tape Dental Injury: Teeth and Oropharynx as per pre-operative assessment and Injury to lip

## 2012-10-16 NOTE — Anesthesia Postprocedure Evaluation (Signed)
  Anesthesia Post-op Note  Patient: Brad Martin  Procedure(s) Performed: Procedure(s): LAPAROSCOPIC CHOLECYSTECTOMY WITH INTRAOPERATIVE CHOLANGIOGRAM (N/A)  Patient Location: PACU  Anesthesia Type:General  Level of Consciousness: awake, alert , oriented and patient cooperative  Airway and Oxygen Therapy: Patient Spontanous Breathing  Post-op Pain: mild  Post-op Assessment: Post-op Vital signs reviewed, Patient's Cardiovascular Status Stable, Respiratory Function Stable, Patent Airway, No signs of Nausea or vomiting and Pain level controlled  Post-op Vital Signs: stable  Complications: No apparent anesthesia complications

## 2012-10-16 NOTE — Anesthesia Preprocedure Evaluation (Addendum)
Anesthesia Evaluation  Patient identified by MRN, date of birth, ID band Patient awake    Reviewed: Allergy & Precautions, H&P , NPO status , Patient's Chart, lab work & pertinent test results  Airway Mallampati: I TM Distance: >3 FB Neck ROM: full    Dental  (+) Teeth Intact and Dental Advisory Given   Pulmonary          Cardiovascular Rhythm:regular Rate:Normal     Neuro/Psych    GI/Hepatic GERD-  ,  Endo/Other    Renal/GU      Musculoskeletal   Abdominal   Peds  Hematology   Anesthesia Other Findings   Reproductive/Obstetrics                          Anesthesia Physical Anesthesia Plan  ASA: I  Anesthesia Plan: General   Post-op Pain Management:    Induction: Intravenous  Airway Management Planned: Oral ETT  Additional Equipment:   Intra-op Plan:   Post-operative Plan: Extubation in OR  Informed Consent: I have reviewed the patients History and Physical, chart, labs and discussed the procedure including the risks, benefits and alternatives for the proposed anesthesia with the patient or authorized representative who has indicated his/her understanding and acceptance.     Plan Discussed with: CRNA, Anesthesiologist and Surgeon  Anesthesia Plan Comments:         Anesthesia Quick Evaluation  

## 2012-10-16 NOTE — H&P (View-Only) (Signed)
Patient ID: Brad Martin, male   DOB: 11/25/1980, 31 y.o.   MRN: [id number]  No chief complaint on file.   HPI Brad Martin is a 31 y.o. male.   HPI 31-year-old Caucasian male referred by Dr. Will Outlaw for consideration for cholecystectomy. The patient states his symptoms started about a week before Easter. He states that he had about 7-10 days of back pain but didn't think much of it. On Easter he was at work and became very nauseous, dizzy and began vomiting. He ended up seeing a primary care physician at [facility] and was found to have elevated LFTs. This prompted an ultrasound as well as a CT scan as well as referral to Dr. Outlaw. He appeared to have cholelithiasis as well as choledocholithiasis. He underwent EUS and ERCP on April 30. A sphincterotomy  Was performed and the stone was extracted. Since then the patient has had ongoing jaundice, yellowing of the eyes, and acholic stools. This has been going on since the symptoms started around Easter. He denies any abdominal pain. He states that he has lost his appetite. He feels gassy and bloated if he eats something greasy or fried. He has modified his diet. He states that his stools are quite. He denies any foreign travel. He denies  New medication. He denies using NSAIDs. He denies any family history of cancer. He drinks alcohol on an occasional basis. He denies any drugs.  He is married and states that he is having a difficult time in his marriage.  He had repeat LFTs drawn yesterday which revealed an even higher bilirubin and the plan is for him to have a repeat ERCP next Wednesday on May 21 Past Medical History  Diagnosis Date  . GERD (gastroesophageal reflux disease)     rarely  . Jaundice Spring 2014    Past Surgical History  Procedure Laterality Date  . Multiple tooth extractions  Mid October 2013    infection in jaw, 2 teeth removed  . Eus N/A 09/06/2012    Procedure: UPPER ENDOSCOPIC ULTRASOUND (EUS) LINEAR;  Surgeon: William  Outlaw, MD;  Location: [facility];  Service: Endoscopy;  Laterality: N/A;  . Ercp N/A 09/06/2012    Procedure: ENDOSCOPIC RETROGRADE CHOLANGIOPANCREATOGRAPHY (ERCP);  Surgeon: William Outlaw, MD;  Location: [facility];  Service: Endoscopy;  Laterality: N/A;    Family History  Problem Relation Age of Onset  . Stroke Mother     Social History History  Substance Use Topics  . Smoking status: Never Smoker   . Smokeless tobacco: Not on file  . Alcohol Use: Yes     Comment: rare    Allergies  Allergen Reactions  . Benadryl (Diphenhydramine Hcl) Other (See Comments)    Hallucinations  . Other Other (See Comments)    Benzodine-Hallocinations    Current Outpatient Prescriptions  Medication Sig Dispense Refill  . acetaminophen (TYLENOL) 325 MG tablet Take 650 mg by mouth every 6 (six) hours as needed for pain.       . amoxicillin-clavulanate (AUGMENTIN) 875-125 MG per tablet Take 1 tablet by mouth 2 (two) times daily.  12 tablet  0   No current facility-administered medications for this visit.    Review of Systems Review of Systems  Constitutional: Positive for appetite change and unexpected weight change. Negative for fever and chills.  HENT: Negative for congestion, trouble swallowing and neck stiffness.   Eyes: Negative for visual disturbance.  Respiratory: Negative for chest tightness and shortness of breath.   Cardiovascular: Negative for   chest pain and leg swelling.       No PND, no orthopnea, no DOE  Gastrointestinal:       See HPI  Genitourinary: Negative for dysuria and hematuria.  Musculoskeletal: Negative.   Skin: Negative for rash.  Neurological: Negative for seizures and speech difficulty.  Hematological: Does not bruise/bleed easily.  Psychiatric/Behavioral: Negative for behavioral problems and confusion.    Blood pressure 104/66, pulse 60, temperature 97.6 F (36.4 C), resp. rate 14, height 6' 1" (1.854 m), weight 209 lb 3.2 oz (94.892 kg).  Physical  Exam Physical Exam  Vitals reviewed. Constitutional: He is oriented to person, place, and time. He appears well-developed and well-nourished. No distress.  HENT:  Head: Normocephalic and atraumatic.  Right Ear: External ear normal.  Left Ear: External ear normal.  Eyes: Conjunctivae are normal. Scleral icterus is present.  Neck: Normal range of motion. Neck supple. No tracheal deviation present.  Cardiovascular: Normal rate, regular rhythm and normal heart sounds.   Pulmonary/Chest: Effort normal. No respiratory distress. He has no wheezes.  Abdominal: Soft. He exhibits no distension. There is no tenderness. There is no rebound and no guarding.  Musculoskeletal: He exhibits no edema and no tenderness.  Lymphadenopathy:    He has no cervical adenopathy.  Neurological: He is alert and oriented to person, place, and time.  Skin: Skin is warm and dry. He is not diaphoretic.  jaundice  Psychiatric: He has a normal mood and affect. His behavior is normal. Judgment and thought content normal.    Data Reviewed COMPLETE ABDOMINAL ULTRASOUND  Comparison: None.  Findings:  Gallbladder: There is an 18 mm stone and possibly other small  stones in the gallbladder. The stones are mobile. No gallbladder  wall thickening. Negative sonographic Murphy's sign.  Common bile duct: There is dilatation of the intra and  extrahepatic bile ducts. Common bile duct is 9 mm in diameter.  The distal common bile duct is not well seen. The possibility of a  mass or stone in the duct should be considered.  Liver: Intrahepatic ductal dilatation. Otherwise, normal.  IVC: Normal.  Pancreas: Obscured by bowel gas.  Spleen: Normal. 9.0 cm in length.  Right Kidney: Normal. 12.5 cm in length.  Left Kidney: Normal. 12.3 cm in length.  Abdominal aorta: Normal. 2.2 cm in diameter.  IMPRESSION: Dilated intra and extrahepatic bile ducts consistent  with distal common bile duct obstruction. The distal duct and head  of  the pancreas are not visible due to bowel gas. Cholelithiasis.  CT ABDOMEN WITHOUT AND WITH CONTRAST  Technique: Multidetector CT imaging of the abdomen was performed  following the standard protocol before and during bolus  administration of intravenous contrast.  Contrast: 100 ml Omnipaque  Comparison: Ultrasound 09/05/2012  Findings: Lung bases are clear. No pericardial fluid.  There is intrahepatic and extrahepatic biliary duct dilatation with  the common bile duct is dilated to 11 mm. There is no enhancing  hepatic lesion. There is a gallstone within the neck of the  gallbladder measuring 12 mm. Mild gallbladder wall thickening.  There is no radio dense gallstone within the common bile duct.  There is prominence of the ampulla with nodular extension into the  duodenum measuring 16 x 13 mm best seen on image 31, series 3. The  pancreatic duct is not dilated. The pancreas parenchyma appears  normal. A peripancreatic inflammation to suggest acute  pancreatitis.  The spleen, adrenal glands, and kidneys are normal.  The stomach, duodenum, and small bowel   and limited view the colon  unremarkable. No retroperitoneal lymphadenopathy  IMPRESSION:  .  1. Intrahepatic biliary ductal dilatation and common bile duct  dilatation consistent with distal obstruction. No radiodense stone  is present in the common bile duct. Cannot exclude a radiolucent  stone.  2. Prominence of the ampulla which could represent edema or a true  lesion. Recommend ERCP for further evaluation and relief of  obstruction.  3. Cholelithiasis.  4. No evidence of acute pancreatitis.  EUS & ERCP & sphincterotomy note 4/30 Bile duct about 10 mm in maximal diameter. Lots of sludge in gallbladder. Distal shadowing bile duct stone appreciated. Ampulla was prominent, but more from impacted stone and not reminiscent of mass lesion  Bile duct was prominent, approximately 8 mm in diameter. Distal bile duct filling defect  seen. Biliary sphincterotomy performed. Large amount of purulent material extruded the ampulla post sphincterotomy. Balloon was used to extract a solitary 5 mm stone; post occlusion cholangiogram revealed no further biliary filling defects  Dr. Outlaw note 4/29 Labs April 25: White blood cell count 6.5, total bilirubin 4.9, direct bilirubin 2.8, indirect bilirubin 2.2, AST 45, ALT 144, alkaline phosphatase 144 labs May 15: Total bilirubin 10.8, direct bilirubin 5.8, indirect bilirubin 5, AST 109, ALT 52, alkaline phosphatase 87, albumin 4  Assessment    Cholelithiasis Choledocholithiasis status post ERCP and sphincterotomy Jaundice Elevated LFTs     Plan    The patient still has elevated LFTs s/p ERCP with sphincterotomy. It is unclear whether or not he has another common bile duct stone or a common bile duct stricture. Hopefully the situation will be resolved with his repeat ERCP. My suspicion for malignancy is low. I have recommended cholecystectomy after his ERCP next week. I explained to the patient that  If there is a atypical finding on his ERCP then we may not be able to proceed with surgery as planned.  Currently we have the patient scheduled for laparoscopic cholecystectomy next Friday. I explained to the patient that if he develops post ERCP pancreatitis and or if an atypical finding is discovered during his ERCP then that may change plans for surgery.  Hopefully he just has some edema or inflammation from the sphincterotomy.   Because his LFTs are still elevated I placed the patient on prophylactic Augmentin therapy.  I advised the patient that if he develops fever, chills, persistent nausea or vomiting, or abdominal pain or worsening back pain then he needs to go to the emergency department.  I believe the patient's symptoms are consistent with gallbladder disease.  I discussed laparoscopic cholecystectomy with IOC in detail.  The patient was given educational material as well as  diagrams detailing the procedure.  We discussed the risks and benefits of a laparoscopic cholecystectomy including, but not limited to bleeding, infection, injury to surrounding structures such as the intestine or liver, bile leak, retained gallstones, need to convert to an open procedure, prolonged diarrhea, blood clots such as  DVT, common bile duct injury, anesthesia risks, and possible need for additional procedures.  We discussed the typical post-operative recovery course. I explained that the likelihood of improvement of their symptoms is good.   Roshawnda Pecora M. Millan Legan, MD, FACS General, Bariatric, & Minimally Invasive Surgery [facility]        Riaan Toledo M 09/22/2012, 2:11 PM    

## 2012-10-16 NOTE — Interval H&P Note (Signed)
History and Physical Interval Note:  10/16/2012 7:25 AM  Brad Martin  has presented today for surgery, with the diagnosis of Cholelithiasis, choledocholithiasis  The various methods of treatment have been discussed with the patient and family. After consideration of risks, benefits and other options for treatment, the patient has consented to  Procedure(s): LAPAROSCOPIC CHOLECYSTECTOMY WITH INTRAOPERATIVE CHOLANGIOGRAM (N/A) as a surgical intervention .  The patient's history has been reviewed, patient examined, no change in status, stable for surgery.  I have reviewed the patient's chart and labs.  Questions were answered to the patient's satisfaction.     lfts trending down. Will proceed with surgery  [name]. Andrey Campanile, MD, FACS General, Bariatric, & Minimally Invasive Surgery [facility], [place]  [facility] M

## 2012-10-16 NOTE — Transfer of Care (Signed)
Immediate Anesthesia Transfer of Care Note  Patient: Brad Martin  Procedure(s) Performed: Procedure(s): LAPAROSCOPIC CHOLECYSTECTOMY WITH INTRAOPERATIVE CHOLANGIOGRAM (N/A)  Patient Location: PACU  Anesthesia Type:General  Level of Consciousness: awake, alert  and oriented  Airway & Oxygen Therapy: Patient Spontanous Breathing and Patient connected to nasal cannula oxygen  Post-op Assessment: Report given to PACU RN  Post vital signs: Reviewed and stable  Complications: No apparent anesthesia complications

## 2012-10-16 NOTE — Progress Notes (Signed)
NO C/O NAUSEA STATES NO C/O PAIN NOW JUST NUMBNESS AND VOIDED

## 2012-10-16 NOTE — Op Note (Signed)
Laparoscopic Cholecystectomy with IOC Procedure Note  Indications: This patient presents with symptomatic gallbladder disease and will undergo laparoscopic cholecystectomy. The patient had presented with obstructive jaundice and underwent ERCP & sphincterotomy with extraction of an CBD stone. Post-ERCP his LFTs increased but eventually started to decrease.   Pre-operative Diagnosis: Calculus of gallbladder with other cholecystitis, without mention of obstruction  Post-operative Diagnosis: Same  Surgeon: Atilano Ina   Assistants: Dr Jaclynn Guarneri  Anesthesia: General endotracheal anesthesia  ASA Class: 1  Procedure Details  The patient was seen again in the Holding Room. The risks, benefits, complications, treatment options, and expected outcomes were discussed with the patient. The possibilities of reaction to medication, pulmonary aspiration, perforation of viscus, bleeding, recurrent infection, finding a normal gallbladder, the need for additional procedures, failure to diagnose a condition, the possible need to convert to an open procedure, and creating a complication requiring transfusion or operation were discussed with the patient. The likelihood of improving the patient's symptoms with return to their baseline status is good.  The patient and/or family concurred with the proposed plan, giving informed consent. The site of surgery properly noted. The patient was taken to Operating Room, identified as Brad Martin and the procedure verified as Laparoscopic Cholecystectomy with Intraoperative Cholangiogram. A Time Out was held and the above information confirmed.  Prior to the induction of general anesthesia, antibiotic prophylaxis was administered. General endotracheal anesthesia was then administered and tolerated well. After the induction, the abdomen was prepped with Chloraprep and draped in the sterile fashion. The patient was positioned in the supine position.  Local anesthetic agent  was injected into the skin near the umbilicus and an incision made. We dissected down to the abdominal fascia with blunt dissection.  The fascia was incised vertically and we entered the peritoneal cavity bluntly.  A pursestring suture of 0-Vicryl was placed around the fascial opening.  The Hasson cannula was inserted and secured with the stay suture.  Pneumoperitoneum was then created with CO2 and tolerated well without any adverse changes in the patient's vital signs. An 5-mm port was placed in the subxiphoid position.  Two 5-mm ports were placed in the right upper quadrant. All skin incisions were infiltrated with a local anesthetic agent before making the incision and placing the trocars.   We positioned the patient in reverse Trendelenburg, tilted slightly to the patient's left.  The gallbladder was identified, the fundus grasped and retracted cephalad. Adhesions were lysed bluntly and with the electrocautery where indicated, taking care not to injure any adjacent organs or viscus. The infundibulum was grasped and retracted laterally, exposing the peritoneum overlying the triangle of Calot. This was then divided and exposed in a blunt fashion. The gallbladder was chronically thickened.  A critical view of the cystic duct and cystic artery was obtained.  The cystic duct was clearly identified and bluntly dissected circumferentially. The cystic duct was ligated with a clip distally.   An incision was made in the cystic duct and the [facility] cholangiogram catheter introduced. The catheter was secured using a clip. A cholangiogram was then obtained which showed good visualization of the distal and proximal biliary tree with no sign of filling defects or obstruction.  Contrast flowed easily into the duodenum. The catheter was then removed.   The cystic duct was then ligated with 1 clip and divided. A PDS endoloop was then placed around the cystic duct stump remnant.  The cystic artery was identified, dissected free,  ligated with clips and divided as well.  The gallbladder was dissected from the liver bed in retrograde fashion with the electrocautery. Part of the gallbladder was intra-hepatic. The gallbladder was removed and placed in an Endocatch sac.  The gallbladder and Endocatch sac were then removed through the umbilical port site. The liver bed was irrigated and inspected. Hemostasis was achieved with the electrocautery. Copious irrigation was utilized and was repeatedly aspirated until clear.  A piece of Surgical SNoW was placed in the gallbladder fossa where I had cauterized a bleeder. The pursestring suture was used to close the umbilical fascia.  An additional interrupted 0-vicryl suture was placed at the umbilical fascia closure.   We again inspected the right upper quadrant for hemostasis.  The umbilical closure was inspected and there was no air leak and nothing trapped within the closure. Pneumoperitoneum was released as we removed the trocars.  4-0 Monocryl was used to close the skin.   Benzoin, steri-strips, and clean dressings were applied. The patient was then extubated and brought to the recovery room in stable condition. Instrument, sponge, and needle counts were correct at closure and at the conclusion of the case.   Findings: Chronic Cholecystitis with Cholelithiasis  Estimated Blood Loss: Minimal         Drains: none         Specimens: Gallbladder           Complications: None; patient tolerated the procedure well.         Disposition: PACU - hemodynamically stable.         Condition: stable

## 2012-10-17 ENCOUNTER — Encounter (HOSPITAL_COMMUNITY): Payer: Self-pay | Admitting: General Surgery

## 2012-10-17 ENCOUNTER — Telehealth (INDEPENDENT_AMBULATORY_CARE_PROVIDER_SITE_OTHER): Payer: Self-pay | Admitting: *Deleted

## 2012-10-17 NOTE — Telephone Encounter (Signed)
Patient and wife called to ask about changing dressings over the incision sites.  Explained that it is ok to change the dressings but leave the steristrips in place.  Wife also states there is some bleeding coming from the incision sites.  Explained that mild bleeding is normal.  Went over signs/symptoms of infection with the wife who states understanding.  Patient and wife at this time are agreeable and state understanding.

## 2012-10-19 ENCOUNTER — Encounter (INDEPENDENT_AMBULATORY_CARE_PROVIDER_SITE_OTHER): Payer: Self-pay

## 2012-10-19 ENCOUNTER — Telehealth (INDEPENDENT_AMBULATORY_CARE_PROVIDER_SITE_OTHER): Payer: Self-pay

## 2012-10-19 NOTE — Telephone Encounter (Signed)
Pt calling for refill of pain medication.  Protocol Vicodin called to [facility] on [address].

## 2012-10-25 ENCOUNTER — Telehealth (INDEPENDENT_AMBULATORY_CARE_PROVIDER_SITE_OTHER): Payer: Self-pay

## 2012-10-25 NOTE — Telephone Encounter (Signed)
Can have refill per protocol. No refill. Disp 40 tablets

## 2012-10-25 NOTE — Telephone Encounter (Signed)
Patient calling into office requesting a refill on pain medication of Hydrocodone 5/325mg , 1-2 tablet by mouth every 4-6 (six) hours as needed for pain. Patient reports that he has a pulling sensation from his internal sutures.  Patient has returned to normal activities.  Patient states that with his daily activity and going back to work he has more discomfort during the evening.  Over the counter pain medication not helping with discomfort.  Patient aware that his refill request would be sent to Dr. Andrey Campanile for review and we will contact patient accordingly.  Patient s/p Lap Chole 10/16/12.

## 2012-10-26 ENCOUNTER — Other Ambulatory Visit (INDEPENDENT_AMBULATORY_CARE_PROVIDER_SITE_OTHER): Payer: Self-pay

## 2012-10-26 DIAGNOSIS — G8918 Other acute postprocedural pain: Secondary | ICD-10-CM

## 2012-10-26 MED ORDER — HYDROCODONE-ACETAMINOPHEN 5-325 MG PO TABS: 1.0000 | ORAL_TABLET | Freq: Four times a day (QID) | ORAL | Status: DC | PRN

## 2012-10-26 NOTE — Telephone Encounter (Signed)
Called and left message for patient to advised that prescription for Hydrocodone 5/325mg  has been approved and sent to patients pharmacy for refill.

## 2012-11-07 ENCOUNTER — Encounter (INDEPENDENT_AMBULATORY_CARE_PROVIDER_SITE_OTHER): Payer: Self-pay | Admitting: General Surgery

## 2012-11-07 ENCOUNTER — Ambulatory Visit (INDEPENDENT_AMBULATORY_CARE_PROVIDER_SITE_OTHER): Payer: BC Managed Care – PPO | Admitting: General Surgery

## 2012-11-07 VITALS — BP 114/76 | HR 76 | Temp 97.9°F | Resp 14 | Ht 72.0 in | Wt 214.4 lb

## 2012-11-07 DIAGNOSIS — R7989 Other specified abnormal findings of blood chemistry: Secondary | ICD-10-CM

## 2012-11-07 NOTE — Patient Instructions (Signed)
Get your labs drawn We will call you with the results

## 2012-11-07 NOTE — Progress Notes (Signed)
Subjective:     Patient ID: Brad Martin, male   DOB: [birth year], 32 y.o.   MRN: [id number]  HPI 32 year old Caucasian male comes in for followup after undergoing laparoscopic cholecystectomy with cholangiogram on 10/16/2012. He had originally Presented with obstructive jaundice And underwent ERCP and sphincterotomy and removal of common bile duct stone. However his LFTs increased after his ERCP. He underwent an MRCP which was normal. His LFTs started to trend down we proceeded with surgery.  He denies any fever, chills, nausea, vomiting, constipation. He states his stools are for the most part normal. There are no longer acholic. He reports he is doing well. He denies any jaundice  Review of Systems     Objective:   Physical Exam BP 114/76  Pulse 76  Temp(Src) 97.9 F (36.6 C)  Resp 14  Ht 6' (1.829 m)  Wt 214 lb 6.4 oz (97.251 kg)  BMI 29.07 kg/m2  Gen: alert, NAD, non-toxic appearing Pupils: equal, no scleral icterus Pulm: Lungs clear to auscultation, symmetric chest rise CV: regular rate and rhythm Abd: soft, nontender, nondistended. Well-healed trocar sites. No cellulitis. No incisional hernia Ext: no edema, no calf tenderness Skin: no rash, no jaundice     Assessment:     Status post laparoscopic cholecystectomy with cholangiogram for chronic cholecystitis and cholelithiasis     Plan:     We reviewed his pathology report which showed chronic cholecystitis and cholelithiasis. I have released him to full activities. I told him that I wanted to repeat his LFTs just to make sure they have normalized. They were still slightly elevated preoperatively. Assuming they have normalized he will followup as needed.  [name]. Andrey Campanile, MD, FACS General, Bariatric, & Minimally Invasive Surgery [facility], [place]

## 2012-11-08 LAB — COMPREHENSIVE METABOLIC PANEL
ALT: 30 U/L (ref 0–53)
AST: 19 U/L (ref 0–37)
CO2: 25 mEq/L (ref 19–32)
Calcium: 10.4 mg/dL (ref 8.4–10.5)
Chloride: 104 mEq/L (ref 96–112)
Sodium: 138 mEq/L (ref 135–145)
Total Protein: 6.6 g/dL (ref 6.0–8.3)

## 2012-11-09 ENCOUNTER — Telehealth (INDEPENDENT_AMBULATORY_CARE_PROVIDER_SITE_OTHER): Payer: Self-pay | Admitting: General Surgery

## 2012-11-09 NOTE — Telephone Encounter (Signed)
LMOM 3:59 7/3 asking patient to return my call

## 2012-11-09 NOTE — Telephone Encounter (Signed)
Message copied by June Leap on Thu Nov 09, 2012  3:58 PM ------      Message from: Andrey Campanile, ERIC M      Created: Thu Nov 09, 2012 12:47 PM       pls call pt -lfts are all normal except bilirubin 1.3; cutoff is 1.2; rec repeat LFTs in 2 weeks. ------

## 2012-11-16 NOTE — Telephone Encounter (Signed)
7/10 @12:20 

## 2012-11-16 NOTE — Telephone Encounter (Signed)
Called patient for 2nd time and LMOM to call me back re: Labs

## 2012-11-20 ENCOUNTER — Encounter (INDEPENDENT_AMBULATORY_CARE_PROVIDER_SITE_OTHER): Payer: Self-pay | Admitting: General Surgery

## 2012-11-20 NOTE — Telephone Encounter (Signed)
Letter mailed to patient to let him know of the numerous phone calls and messages left without a response..letter states to contact our office re: lab work asap.Marland KitchenMarland Kitchenput in outgoing mail on 7/14 @1  50

## 2014-01-04 ENCOUNTER — Telehealth (INDEPENDENT_AMBULATORY_CARE_PROVIDER_SITE_OTHER): Payer: Self-pay

## 2014-01-04 ENCOUNTER — Encounter (INDEPENDENT_AMBULATORY_CARE_PROVIDER_SITE_OTHER): Payer: Self-pay | Admitting: General Surgery

## 2014-01-04 ENCOUNTER — Ambulatory Visit (INDEPENDENT_AMBULATORY_CARE_PROVIDER_SITE_OTHER): Payer: BC Managed Care – PPO | Admitting: General Surgery

## 2014-01-04 VITALS — BP 120/78 | HR 72 | Temp 97.8°F | Ht 73.0 in | Wt 205.0 lb

## 2014-01-04 DIAGNOSIS — K4090 Unilateral inguinal hernia, without obstruction or gangrene, not specified as recurrent: Secondary | ICD-10-CM

## 2014-01-04 MED ORDER — TAMSULOSIN HCL 0.4 MG PO CAPS: 0.4000 mg | ORAL_CAPSULE | Freq: Every day | ORAL | Status: DC

## 2014-01-04 NOTE — Patient Instructions (Addendum)
Wear TRUSS as directed Our office will contact you to schedule surgery  Hernia Repair with Laparoscope A hernia occurs when an internal organ pushes out through a weak spot in the belly (abdominal) wall muscles. Hernias most commonly occur in the groin and around the navel. Hernias can also occur through a cut by the surgeon (incision) after an abdominal operation. A hernia may be caused by:  Lifting heavy objects.  Prolonged coughing.  Straining to move your bowels. Hernias can often be pushed back into place (reduced). Most hernias tend to get worse over time. Problems occur when abdominal contents get stuck in the opening and the blood supply is blocked or impaired (incarcerated hernia). Because of these risks, you require surgery to repair the hernia. Your hernia will be repaired using a laparoscope. Laparoscopic surgery is a type of minimally invasive surgery. It does not involve making a typical surgical cut (incision) in the skin. A laparoscope is a telescope-like rod and lens system. It is usually connected to a video camera and a light source so your caregiver can clearly see the operative area. The instruments are inserted through  to  inch (5 mm or 10 mm) openings in the skin at specific locations. A working and viewing space is created by blowing a small amount of carbon dioxide gas into the abdominal cavity. The abdomen is essentially blown up like a balloon (insufflated). This elevates the abdominal wall above the internal organs like a dome. The carbon dioxide gas is common to the human body and can be absorbed by tissue and removed by the respiratory system. Once the repair is completed, the small incisions will be closed with either stitches (sutures) or staples (just like a paper stapler only this staple holds the skin together). LET YOUR CAREGIVERS KNOW ABOUT:  Allergies.  Medications taken including herbs, eye drops, over the counter medications, and creams.  Use of steroids  (by mouth or creams).  Previous problems with anesthetics or Novocaine.  Possibility of pregnancy, if this applies.  History of blood clots (thrombophlebitis).  History of bleeding or blood problems.  Previous surgery.  Other health problems. BEFORE THE PROCEDURE  Laparoscopy can be done either in a hospital or out-patient clinic. You may be given a mild sedative to help you relax before the procedure. Once in the operating room, you will be given a general anesthesia to make you sleep (unless you and your caregiver choose a different anesthetic).  AFTER THE PROCEDURE  After the procedure you will be watched in a recovery area. Depending on what type of hernia was repaired, you might be admitted to the hospital or you might go home the same day. With this procedure you may have less pain and scarring. This usually results in a quicker recovery and less risk of infection. HOME CARE INSTRUCTIONS   Bed rest is not required. You may continue your normal activities but avoid heavy lifting (more than 10 pounds) or straining.  Cough gently. If you are a smoker it is best to stop, as even the best hernia repair can break down with the continual strain of coughing.  Avoid driving until given the OK by your surgeon.  There are no dietary restrictions unless given otherwise.  TAKE ALL MEDICATIONS AS DIRECTED.  Only take over-the-counter or prescription medicines for pain, discomfort, or fever as directed by your caregiver. SEEK MEDICAL CARE IF:   There is increasing abdominal pain or pain in your incisions.  There is more bleeding from incisions,  other than minimal spotting.  You feel light headed or faint.  You develop an unexplained fever, chills, and/or an oral temperature above 102 F (38.9 C).  You have redness, swelling, or increasing pain in the wound.  Pus coming from wound.  A foul smell coming from the wound or dressings. SEEK IMMEDIATE MEDICAL CARE IF:   You develop a  rash.  You have difficulty breathing.  You have any allergic problems. MAKE SURE YOU:   Understand these instructions.  Will watch your condition.  Will get help right away if you are not doing well or get worse. Document Released: 04/26/2005 Document Revised: 07/19/2011 Document Reviewed: 03/26/2009 [facility] Patient Information 2015 [place], [place]. This information is not intended to replace advice given to you by your health care provider. Make sure you discuss any questions you have with your health care provider.

## 2014-01-04 NOTE — Telephone Encounter (Signed)
V/M patient appt time changed from 4:30 to 4:00 with DR. Andrey Campanile today

## 2014-01-04 NOTE — Telephone Encounter (Signed)
Ask if patient can come in at 245p for a 3p appt with DR. Andrey Campanile

## 2014-01-06 NOTE — Progress Notes (Signed)
Patient ID: Brad Martin, male   DOB: 1981-03-21, 33 y.o.   MRN: [id number]  Chief Complaint  Patient presents with  . eval hernia    HPI Brad Martin is a 33 y.o. male.   HPI 33 yo WM referred Dr Dulce Sellar for left inguinal hernia. He states he was doing a lot of heavy lifting at work  Emptying trash cans about 2 months ago when he developed a lot of left groin pain. He describes it as stinging and burning. He also notices a bulge that will come and go. If he puts pressure in area, it will help with discomfort. The discomfort is increased by the end of the day along with the bulge. He also notices that his BMs are now more every other day or daily.   Past Medical History  Diagnosis Date  . Jaundice Spring 2014  . Complication of anesthesia 09-06-2012    upset during procedure, had to change to another anesthetic, respiration complication   . GERD (gastroesophageal reflux disease)     rarely    Past Surgical History  Procedure Laterality Date  . Multiple tooth extractions  Mid October 2013    infection in jaw, 2 teeth removed  . Eus N/A 09/06/2012    Procedure: UPPER ENDOSCOPIC ULTRASOUND (EUS) LINEAR;  Surgeon: Willis Modena, MD;  Location: [facility];  Service: Endoscopy;  Laterality: N/A;  . Ercp N/A 09/06/2012    Procedure: ENDOSCOPIC RETROGRADE CHOLANGIOPANCREATOGRAPHY (ERCP);  Surgeon: Willis Modena, MD;  Location: [facility];  Service: Endoscopy;  Laterality: N/A;  . Cholecystectomy N/A 10/16/2012    Procedure: LAPAROSCOPIC CHOLECYSTECTOMY WITH INTRAOPERATIVE CHOLANGIOGRAM;  Surgeon: Atilano Ina, MD;  Location: [facility] OR;  Service: General;  Laterality: N/A;    Family History  Problem Relation Age of Onset  . Stroke Mother     Social History History  Substance Use Topics  . Smoking status: Never Smoker   . Smokeless tobacco: Never Used  . Alcohol Use: Yes     Comment: rare    Allergies  Allergen Reactions  . Benadryl [Diphenhydramine Hcl] Other (See Comments)   Hallucinations  . Other Other (See Comments)    Benzodine-Hallocinations    Current Outpatient Prescriptions  Medication Sig Dispense Refill  . tamsulosin (FLOMAX) 0.4 MG CAPS capsule Take 1 capsule (0.4 mg total) by mouth daily.  7 capsule  0   No current facility-administered medications for this visit.    Review of Systems Review of Systems  Constitutional: Negative for fever, chills, appetite change and unexpected weight change.  HENT: Negative for congestion and trouble swallowing.   Eyes: Negative for visual disturbance.  Respiratory: Negative for chest tightness and shortness of breath.   Cardiovascular: Negative for chest pain and leg swelling.       No PND, no orthopnea, no DOE  Gastrointestinal: Positive for constipation. Negative for nausea, vomiting, abdominal pain and diarrhea.       See HPI  Genitourinary: Negative for dysuria and hematuria.       Some hesistancy, some incomplete emptying.   Musculoskeletal: Negative.   Skin: Negative for rash.  Neurological: Negative for seizures and speech difficulty.  Hematological: Does not bruise/bleed easily.  Psychiatric/Behavioral: Negative for behavioral problems and confusion.    Blood pressure 120/78, pulse 72, temperature 97.8 F (36.6 C), height  (1.854 m), weight 205 lb (92.987 kg).  Physical Exam Physical Exam  Constitutional: He is oriented to person, place, and time. He appears well-developed and well-nourished. No distress.  HENT:  Head: Normocephalic and atraumatic.  Right Ear: External ear normal.  Left Ear: External ear normal.  Eyes: Conjunctivae are normal. No scleral icterus.  Neck: Normal range of motion. Neck supple. No tracheal deviation present. No thyromegaly present.  Cardiovascular: Normal rate, normal heart sounds and intact distal pulses.   Pulmonary/Chest: Effort normal and breath sounds normal. No respiratory distress. He has no wheezes.  Abdominal: Soft. He exhibits no distension.  There is no tenderness. There is no rebound and no guarding. A hernia is present. Hernia confirmed positive in the left inguinal area. Hernia confirmed negative in the right inguinal area.    Genitourinary: Testes normal and penis normal.     Obvious Left groin bulge on standing; reducible. Some TTP on palpation of deep inguinal ring.   Musculoskeletal: Normal range of motion. He exhibits no edema and no tenderness.  Lymphadenopathy:    He has no cervical adenopathy.  Neurological: He is alert and oriented to person, place, and time. He exhibits normal muscle tone.  Skin: Skin is warm and dry. No rash noted. He is not diaphoretic. No erythema. No pallor.  Psychiatric: He has a normal mood and affect. His behavior is normal. Judgment and thought content normal.    Data Reviewed Dr Hulen Shouts note  Assessment    Left inguinal hernia     Plan    We discussed the etiology of inguinal hernias. We discussed the signs & symptoms of incarceration & strangulation.  We discussed non-operative and operative management.  The patient has elected to proceed with LAPAROSCOPIC LEFT INGUINAL HERNIA REPAIR WITH MESH   I described the procedure in detail.  The patient was given educational material. We discussed the risks and benefits including but not limited to bleeding, infection, chronic inguinal pain, nerve entrapment, hernia recurrence, mesh complications, hematoma formation, urinary retention, injury to the testicles or the ovaries, numbness in the groin, blood clots, injury to the surrounding structures, and anesthesia risk. We also discussed the typical post operative recovery course, including no heavy lifting for 4-6 weeks. I explained that the likelihood of improvement of their symptoms is good. We did discuss that he may be slightly higher risk for chronic pain given his preop pain  Will prescribe perioperative flomax to decrease risk of urinary retention,  Our office will contact him to  schedule surgery.   [name]. Andrey Campanile, MD, FACS General, Bariatric, & Minimally Invasive Surgery [facility], [place]          [facility] M 01/06/2014, 7:21 PM

## 2014-02-26 IMAGING — RF DG CHOLANGIOGRAM OPERATIVE
1 series · 5 of 5 positions shown · non-contrast
Comparison: 09/25/2012

CLINICAL DATA: Cholelithiasis

INTRAOPERATIVE CHOLANGIOGRAM
TECHNIQUE: Multiple fluoroscopic spot radiographs were obtained
during intraoperative cholangiogram and are submitted for
interpretation post-operatively. 13 seconds of fluoroscopy was
utilized.

[Series 1: run · 2 acquisitions, 5 frames shown]
[im 1/2]
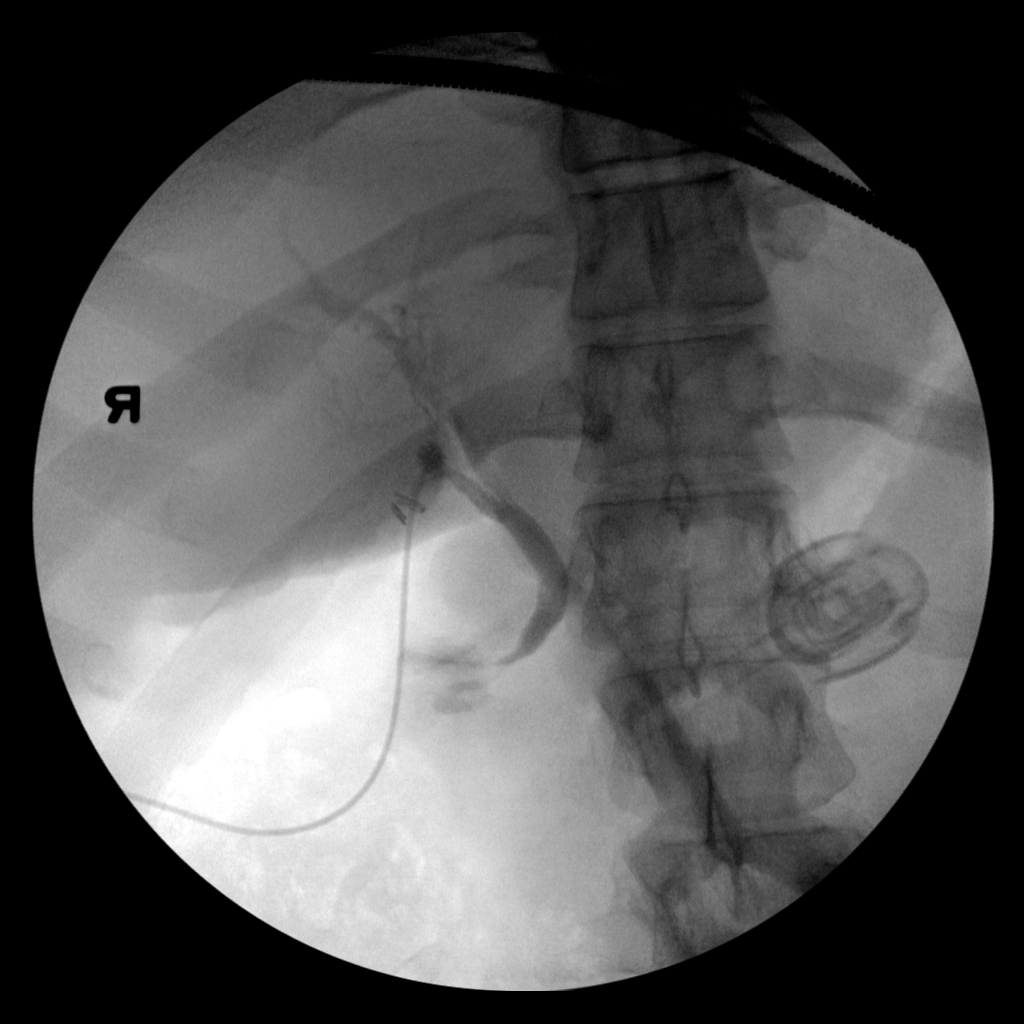
[im 1/2]
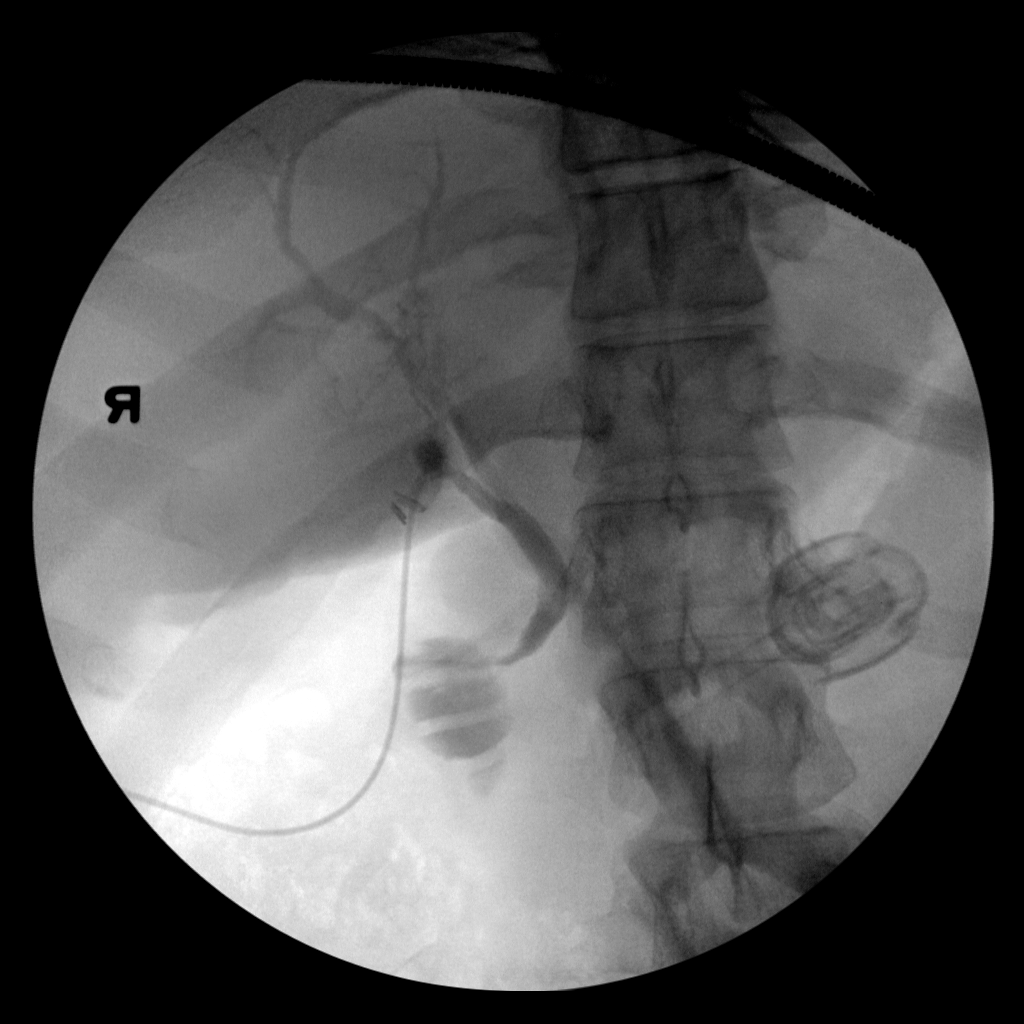
[im 1/2]
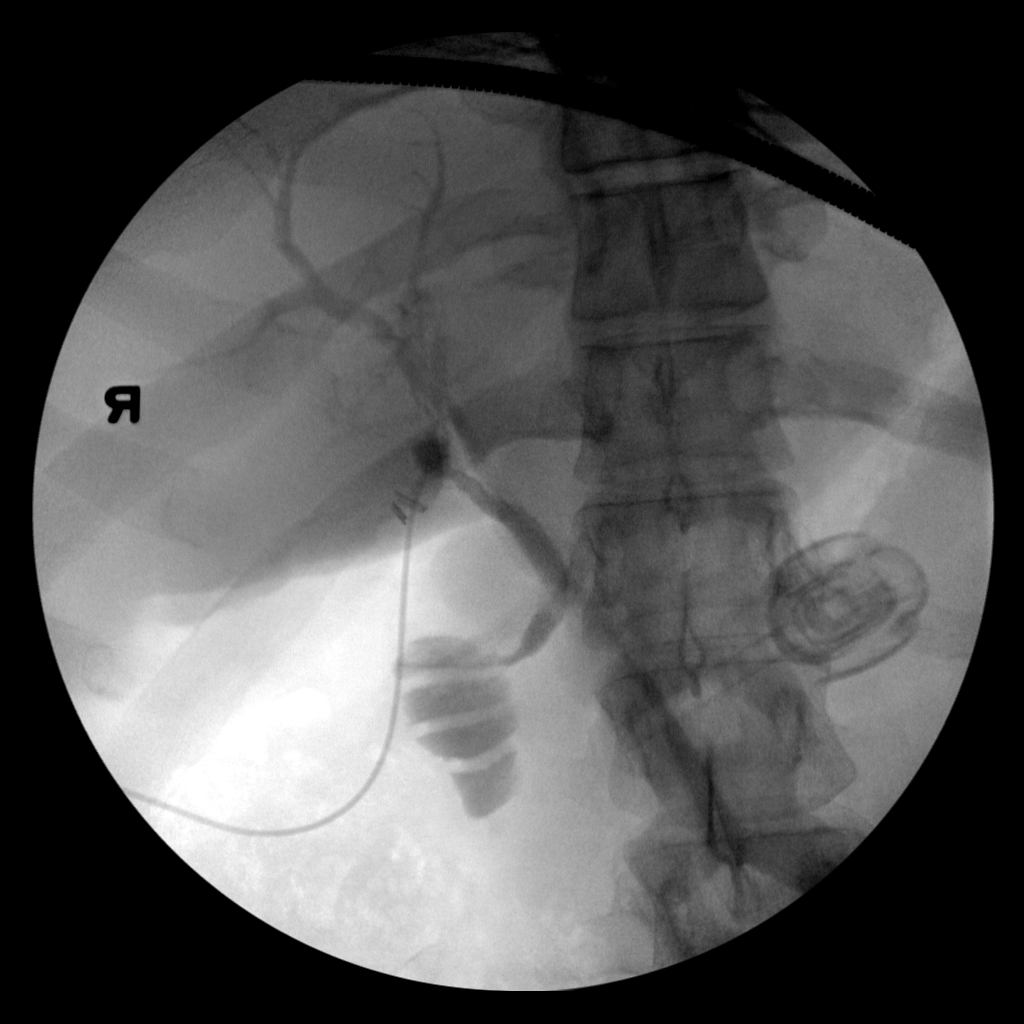
[im 1/2]
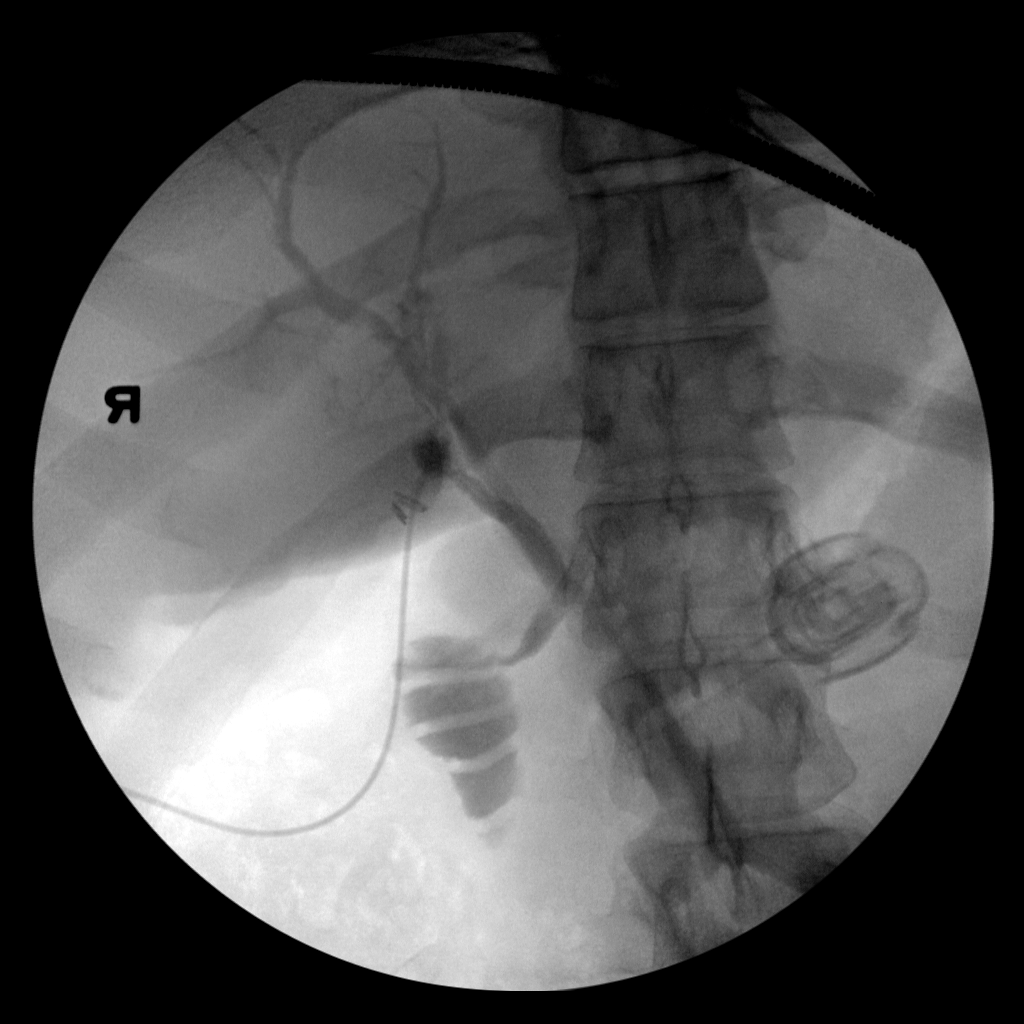
[im 2/2]
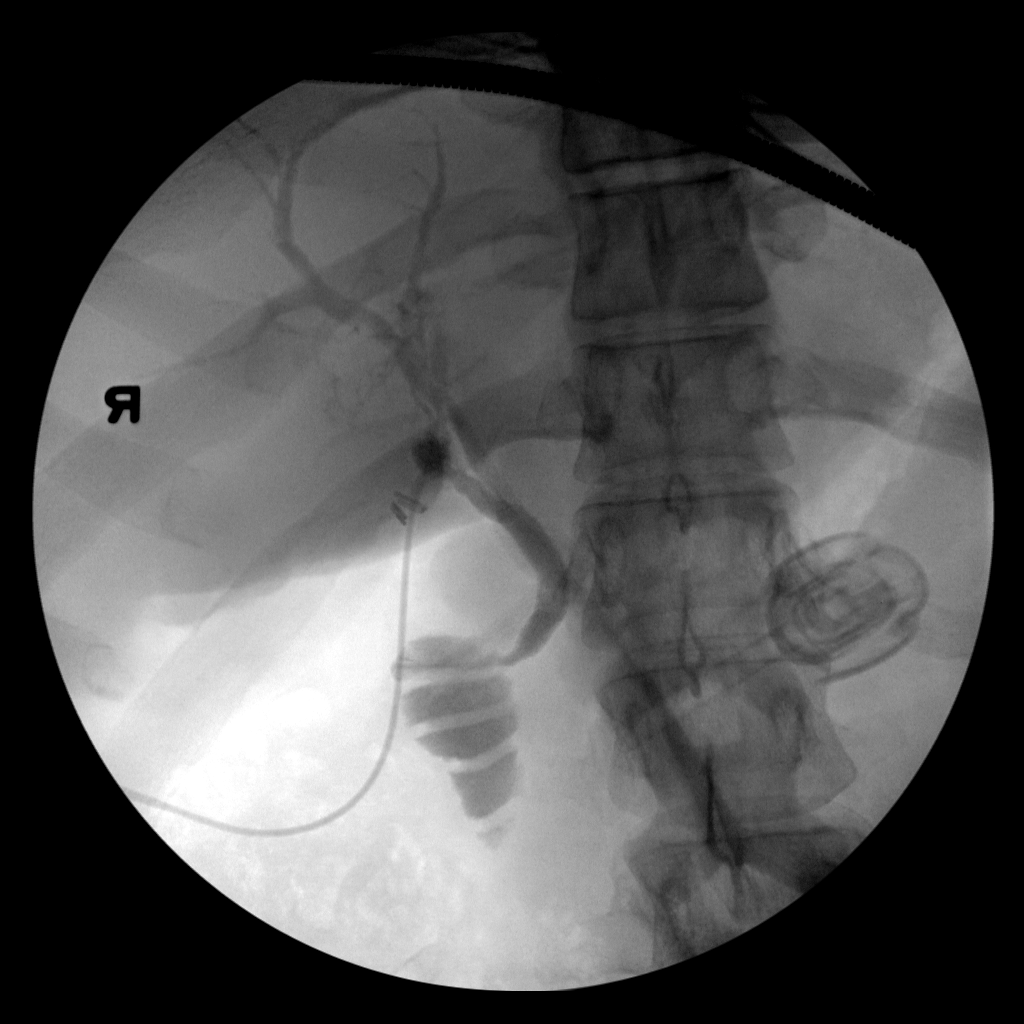

[5 of 5 positions shown; findings below may reference images not displayed]

FINDINGS: Intraoperative cholangiogram shows opacification of the
intrahepatic and extrahepatic biliary tree.  No filling defects are
seen.  Contrast material flows into the duodenum.

## 2014-05-10 DIAGNOSIS — K4090 Unilateral inguinal hernia, without obstruction or gangrene, not specified as recurrent: Secondary | ICD-10-CM

## 2014-05-10 HISTORY — DX: Unilateral inguinal hernia, without obstruction or gangrene, not specified as recurrent: K40.90

## 2014-06-07 ENCOUNTER — Encounter (HOSPITAL_BASED_OUTPATIENT_CLINIC_OR_DEPARTMENT_OTHER): Payer: Self-pay | Admitting: *Deleted

## 2014-06-10 ENCOUNTER — Encounter (HOSPITAL_BASED_OUTPATIENT_CLINIC_OR_DEPARTMENT_OTHER): Payer: Self-pay | Admitting: *Deleted

## 2014-06-10 ENCOUNTER — Ambulatory Visit (INDEPENDENT_AMBULATORY_CARE_PROVIDER_SITE_OTHER): Payer: Self-pay | Admitting: General Surgery

## 2014-06-11 ENCOUNTER — Ambulatory Visit (HOSPITAL_COMMUNITY): Payer: 59 | Admitting: Registered Nurse

## 2014-06-11 ENCOUNTER — Encounter (HOSPITAL_COMMUNITY): Payer: Self-pay

## 2014-06-11 ENCOUNTER — Encounter (HOSPITAL_COMMUNITY)
Admission: RE | Disposition: A | Discharge status: Home / Self Care | Payer: Self-pay | Source: Ambulatory Visit | Attending: General Surgery

## 2014-06-11 ENCOUNTER — Ambulatory Visit (HOSPITAL_COMMUNITY)
Admission: RE | Admit: 2014-06-11 | Discharge: 2014-06-11 | Disposition: A | Discharge status: Home / Self Care | Payer: 59 | Source: Ambulatory Visit | Attending: General Surgery | Admitting: General Surgery

## 2014-06-11 DIAGNOSIS — K4090 Unilateral inguinal hernia, without obstruction or gangrene, not specified as recurrent: Secondary | ICD-10-CM | POA: Diagnosis present

## 2014-06-11 HISTORY — DX: Unilateral inguinal hernia, without obstruction or gangrene, not specified as recurrent: K40.90

## 2014-06-11 HISTORY — PX: INGUINAL HERNIA REPAIR: SHX194

## 2014-06-11 HISTORY — PX: INSERTION OF MESH: SHX5868

## 2014-06-11 LAB — HEMOGLOBIN: Hemoglobin: 14.2 g/dL (ref 13.0–17.0)

## 2014-06-11 SURGERY — REPAIR, HERNIA, INGUINAL, LAPAROSCOPIC
Anesthesia: General | Site: Abdomen | Laterality: Left

## 2014-06-11 MED ORDER — SODIUM CHLORIDE 0.9 % IJ SOLN: 3.0000 mL | INTRAMUSCULAR | Status: DC | PRN

## 2014-06-11 MED ORDER — MEPERIDINE HCL 50 MG/ML IJ SOLN: 6.2500 mg | INTRAMUSCULAR | Status: DC | PRN

## 2014-06-11 MED ORDER — PROPOFOL 10 MG/ML IV BOLUS
INTRAVENOUS | Status: AC
  Filled 2014-06-11: qty 20

## 2014-06-11 MED ORDER — FENTANYL CITRATE 0.05 MG/ML IJ SOLN
INTRAMUSCULAR | Status: AC
  Filled 2014-06-11: qty 5

## 2014-06-11 MED ORDER — GLYCOPYRROLATE 0.2 MG/ML IJ SOLN
INTRAMUSCULAR | Status: AC
  Filled 2014-06-11: qty 5

## 2014-06-11 MED ORDER — SODIUM CHLORIDE 0.9 % IJ SOLN: 3.0000 mL | Freq: Two times a day (BID) | INTRAMUSCULAR | Status: DC

## 2014-06-11 MED ORDER — DEXAMETHASONE SODIUM PHOSPHATE 10 MG/ML IJ SOLN
INTRAMUSCULAR | Status: DC | PRN
  Administered 2014-06-11: 10 mg via INTRAVENOUS

## 2014-06-11 MED ORDER — ACETAMINOPHEN 500 MG PO TABS
1000.0000 mg | ORAL_TABLET | Freq: Four times a day (QID) | ORAL | Status: DC
  Filled 2014-06-11 (×4): qty 2

## 2014-06-11 MED ORDER — FENTANYL CITRATE 0.05 MG/ML IJ SOLN: 25.0000 ug | INTRAMUSCULAR | Status: DC | PRN

## 2014-06-11 MED ORDER — CHLORHEXIDINE GLUCONATE 4 % EX LIQD: 1.0000 "application " | Freq: Once | CUTANEOUS | Status: DC

## 2014-06-11 MED ORDER — LIDOCAINE HCL (CARDIAC) 20 MG/ML IV SOLN
INTRAVENOUS | Status: DC | PRN
  Administered 2014-06-11: 100 mg via INTRAVENOUS

## 2014-06-11 MED ORDER — SODIUM CHLORIDE 0.9 % IJ SOLN
INTRAMUSCULAR | Status: AC
  Filled 2014-06-11: qty 50

## 2014-06-11 MED ORDER — LACTATED RINGERS IV SOLN: INTRAVENOUS | Status: DC

## 2014-06-11 MED ORDER — MIDAZOLAM HCL 2 MG/2ML IJ SOLN: 1.0000 mg | INTRAMUSCULAR | Status: DC | PRN

## 2014-06-11 MED ORDER — MIDAZOLAM HCL 5 MG/5ML IJ SOLN
INTRAMUSCULAR | Status: DC | PRN
  Administered 2014-06-11: 2 mg via INTRAVENOUS

## 2014-06-11 MED ORDER — NEOSTIGMINE METHYLSULFATE 10 MG/10ML IV SOLN
INTRAVENOUS | Status: DC | PRN
  Administered 2014-06-11: 5 mg via INTRAVENOUS

## 2014-06-11 MED ORDER — SODIUM CHLORIDE 0.9 % IV SOLN: INTRAVENOUS | Status: DC

## 2014-06-11 MED ORDER — MORPHINE SULFATE 10 MG/ML IJ SOLN: 1.0000 mg | INTRAMUSCULAR | Status: DC | PRN

## 2014-06-11 MED ORDER — ACETAMINOPHEN 10 MG/ML IV SOLN
1000.0000 mg | Freq: Once | INTRAVENOUS | Status: AC
  Administered 2014-06-11: 1000 mg via INTRAVENOUS
  Filled 2014-06-11 (×2): qty 100

## 2014-06-11 MED ORDER — BUPIVACAINE-EPINEPHRINE (PF) 0.25% -1:200000 IJ SOLN
INTRAMUSCULAR | Status: AC
  Filled 2014-06-11: qty 30

## 2014-06-11 MED ORDER — BUPIVACAINE LIPOSOME 1.3 % IJ SUSP
20.0000 mL | Freq: Once | INTRAMUSCULAR | Status: AC
  Administered 2014-06-11: 20 mL
  Filled 2014-06-11: qty 20

## 2014-06-11 MED ORDER — CEFAZOLIN SODIUM-DEXTROSE 2-3 GM-% IV SOLR
2.0000 g | INTRAVENOUS | Status: AC
  Administered 2014-06-11: 2 g via INTRAVENOUS

## 2014-06-11 MED ORDER — ONDANSETRON HCL 4 MG/2ML IJ SOLN
INTRAMUSCULAR | Status: AC
  Filled 2014-06-11: qty 2

## 2014-06-11 MED ORDER — SODIUM CHLORIDE 0.9 % IJ SOLN
INTRAMUSCULAR | Status: DC | PRN
  Administered 2014-06-11: 30 mL

## 2014-06-11 MED ORDER — FENTANYL CITRATE 0.05 MG/ML IJ SOLN: 50.0000 ug | INTRAMUSCULAR | Status: DC | PRN

## 2014-06-11 MED ORDER — PROPOFOL 10 MG/ML IV BOLUS
INTRAVENOUS | Status: DC | PRN
  Administered 2014-06-11: 250 mg via INTRAVENOUS

## 2014-06-11 MED ORDER — FENTANYL CITRATE 0.05 MG/ML IJ SOLN
INTRAMUSCULAR | Status: DC | PRN
  Administered 2014-06-11 (×5): 50 ug via INTRAVENOUS
  Administered 2014-06-11: 100 ug via INTRAVENOUS
  Administered 2014-06-11 (×3): 50 ug via INTRAVENOUS

## 2014-06-11 MED ORDER — CEFAZOLIN SODIUM-DEXTROSE 2-3 GM-% IV SOLR
INTRAVENOUS | Status: AC
  Filled 2014-06-11: qty 50

## 2014-06-11 MED ORDER — ROCURONIUM BROMIDE 100 MG/10ML IV SOLN
INTRAVENOUS | Status: DC | PRN
  Administered 2014-06-11: 10 mg via INTRAVENOUS
  Administered 2014-06-11: 40 mg via INTRAVENOUS
  Administered 2014-06-11 (×3): 10 mg via INTRAVENOUS

## 2014-06-11 MED ORDER — MIDAZOLAM HCL 2 MG/ML PO SYRP: 12.0000 mg | ORAL_SOLUTION | Freq: Once | ORAL | Status: DC | PRN

## 2014-06-11 MED ORDER — LACTATED RINGERS IV SOLN
INTRAVENOUS | Status: DC
  Administered 2014-06-11: 1000 mL via INTRAVENOUS
  Administered 2014-06-11: 15:00:00 via INTRAVENOUS

## 2014-06-11 MED ORDER — PROMETHAZINE HCL 25 MG/ML IJ SOLN: 6.2500 mg | INTRAMUSCULAR | Status: DC | PRN

## 2014-06-11 MED ORDER — ACETAMINOPHEN 325 MG PO TABS: 650.0000 mg | ORAL_TABLET | ORAL | Status: DC | PRN

## 2014-06-11 MED ORDER — ACETAMINOPHEN 650 MG RE SUPP
650.0000 mg | RECTAL | Status: DC | PRN
  Filled 2014-06-11: qty 1

## 2014-06-11 MED ORDER — OXYCODONE HCL 5 MG PO TABS: 5.0000 mg | ORAL_TABLET | ORAL | Status: AC | PRN

## 2014-06-11 MED ORDER — OXYCODONE HCL 5 MG PO TABS
5.0000 mg | ORAL_TABLET | ORAL | Status: DC | PRN
Start: 2014-06-11 — End: 2014-06-11

## 2014-06-11 MED ORDER — ONDANSETRON HCL 4 MG/2ML IJ SOLN
INTRAMUSCULAR | Status: DC | PRN
  Administered 2014-06-11: 4 mg via INTRAVENOUS

## 2014-06-11 MED ORDER — MIDAZOLAM HCL 2 MG/2ML IJ SOLN
INTRAMUSCULAR | Status: AC
  Filled 2014-06-11: qty 2

## 2014-06-11 MED ORDER — GLYCOPYRROLATE 0.2 MG/ML IJ SOLN
INTRAMUSCULAR | Status: DC | PRN
  Administered 2014-06-11: 0.2 mg via INTRAVENOUS
  Administered 2014-06-11: .8 mg via INTRAVENOUS

## 2014-06-11 MED ORDER — LIDOCAINE HCL (CARDIAC) 20 MG/ML IV SOLN
INTRAVENOUS | Status: AC
  Filled 2014-06-11: qty 5

## 2014-06-11 MED ORDER — SODIUM CHLORIDE 0.9 % IV SOLN: 250.0000 mL | INTRAVENOUS | Status: DC | PRN

## 2014-06-11 SURGICAL SUPPLY — 33 items
BANDAGE ADH SHEER 1  50/CT (GAUZE/BANDAGES/DRESSINGS) IMPLANT
BENZOIN TINCTURE PRP APPL 2/3 (GAUZE/BANDAGES/DRESSINGS) IMPLANT
DECANTER SPIKE VIAL GLASS SM (MISCELLANEOUS) ×2 IMPLANT
DEVICE SECURE STRAP 25 ABSORB (INSTRUMENTS) IMPLANT
DRAPE LAPAROSCOPIC ABDOMINAL (DRAPES) ×2 IMPLANT
DRSG TEGADERM 2-3/8X2-3/4 SM (GAUZE/BANDAGES/DRESSINGS) IMPLANT
DRSG TEGADERM 4X4.75 (GAUZE/BANDAGES/DRESSINGS) IMPLANT
ELECT REM PT RETURN 9FT ADLT (ELECTROSURGICAL) ×2
ELECTRODE REM PT RTRN 9FT ADLT (ELECTROSURGICAL) ×1 IMPLANT
GLOVE BIO SURGEON STRL SZ7.5 (GLOVE) ×2 IMPLANT
GLOVE BIOGEL M STRL SZ7.5 (GLOVE) IMPLANT
GLOVE INDICATOR 8.0 STRL GRN (GLOVE) ×2 IMPLANT
GOWN STRL REUS W/TWL XL LVL3 (GOWN DISPOSABLE) ×6 IMPLANT
KIT BASIN OR (CUSTOM PROCEDURE TRAY) ×2 IMPLANT
MESH ULTRAPRO 3X6 7.6X15CM (Mesh General) ×2 IMPLANT
NS IRRIG 1000ML POUR BTL (IV SOLUTION) ×2 IMPLANT
RELOAD STAPLE HERNIA 4.0 BLUE (INSTRUMENTS) IMPLANT
RELOAD STAPLE HERNIA 4.8 BLK (STAPLE) IMPLANT
SCALPEL HARMONIC ACE (MISCELLANEOUS) IMPLANT
SCISSORS LAP 5X35 DISP (ENDOMECHANICALS) ×2 IMPLANT
SET IRRIG TUBING LAPAROSCOPIC (IRRIGATION / IRRIGATOR) ×2 IMPLANT
SOLUTION ANTI FOG 6CC (MISCELLANEOUS) ×2 IMPLANT
STAPLER HERNIA 12 8.5 360D (INSTRUMENTS) ×2 IMPLANT
STRIP CLOSURE SKIN 1/2X4 (GAUZE/BANDAGES/DRESSINGS) IMPLANT
SUT MNCRL AB 4-0 PS2 18 (SUTURE) ×2 IMPLANT
SUT VICRYL 0 UR6 27IN ABS (SUTURE) ×2 IMPLANT
TOWEL OR 17X26 10 PK STRL BLUE (TOWEL DISPOSABLE) ×2 IMPLANT
TOWEL OR NON WOVEN STRL DISP B (DISPOSABLE) ×2 IMPLANT
TRAY LAPAROSCOPIC (CUSTOM PROCEDURE TRAY) ×2 IMPLANT
TROCAR BLADELESS OPT 5 75 (ENDOMECHANICALS) ×2 IMPLANT
TROCAR SLEEVE XCEL 5X75 (ENDOMECHANICALS) ×2 IMPLANT
TROCAR XCEL BLUNT TIP 100MML (ENDOMECHANICALS) ×2 IMPLANT
TUBING INSUFFLATION 10FT LAP (TUBING) ×2 IMPLANT

## 2014-06-11 NOTE — Op Note (Signed)
06/11/2014  Brad Martin March 27, 1981   PREOPERATIVE DIAGNOSIS: left inguinal hernia.   POSTOPERATIVE DIAGNOSIS: left indirect inguinal hernia.   PROCEDURE: Laparoscopic repair of left indirect inguinal hernia with  mesh (TAPP).   SURGEON: [name]. Andrey Campanile, MD FACS   ASSISTANT SURGEON: None.   ANESTHESIA: General plus local consisting of 50cc exparel  ESTIMATED BLOOD LOSS: Minimal.   FINDINGS: The patient had a large defect lateral to inferior epigastric vessels.  It was repaired using a 3 inch x 6  inch piece of Ethicon UltraPro mesh. He had some oozing throughout case. Medially the peritoneal reflection was more tethered than usual but all critical structures identified.   SPECIMEN: none  INDICATIONS FOR PROCEDURE: 33yo WM with symptomatic left inguinal hernia presented for laparoscopic repair.  The risks and benefits including but not limited to bleeding, infection, chronic inguinal pain, nerve entrapment, hernia recurrence, mesh complications, hematoma formation, urinary retention, injury to the testicles or the ovaries, numbness in the groin, blood clots, injury to the surrounding structures, and anesthesia risk was discussed with the patient.  DESCRIPTION OF PROCEDURE: After obtaining verbal consent and marking  the left groin in the holding area with the patient confirming the  operative site, the patient was then taken back to the operating room, placed  supine on the operating room table. General endotracheal anesthesia was  established. The patient had emptied their bladder prior to going back to  the operating room. Sequential compression devices were placed. The  abdomen and groin were prepped and draped in the usual standard surgical  fashion with ChloraPrep. The patient received IV antibiotic prior to the incision. A surgical time-out was performed.  Local was infiltrated at the base of the umbilicus.   Next, a 1-cm vertical infraumbilical incision was made with a #11  blade. The fascia  was grasped and lifted anteriorly. Next, the fascia was incised, and  the abdominal cavity was entered. Pursestring suture was placed around  the fascial edges using a 0 Vicryl. A 12-mm Hasson trocar was placed.  Pneumoperitoneum was smoothly established up to a patient pressure of 15  mmHg. Laparoscope was advanced. There was no evidence of a  contralateral hernia. The patient had a defect lateral to  the inferior epigastric vessel, consistent with an left indirect  hernia. Two 5-mm trocars were placed, one on the right, one on the left  in the midclavicular line slightly above the level of the umbilicus all  under direct visualization. After local had been infiltrated, I then  made incision along the peritoneum on the left, starting 2 inches above  the anterior superior iliac spine and caring it medial  toward the median umbilical ligament in a lazy S configuration using  Endo Shears with electrocautery. The peritoneal flap was then gently  dissected downward from the anterior abdominal wall taking care not to  injure the inferior epigastric vessels. The pubic bone was identified.  The testicular vessels were identified.  Using  traction and counter traction with short graspers, I reduced the sac in  its entirety. The testicular vessels had been identified and preserved. The vas deferens was identified and preserved, and the hernia sac was stripped from those to  surrounding structures. He had some oozing from several surfaces and along the peritoneal tissue plane. I then went about creating a large pocket by  lifting the peritoneum of the pelvic floor. Medially where the peritoneal flap curves down near the inferior epigastric vessel - it was quite tethered. I made  sure the vas deferens was clear and out of the way. I was able to strip the peritoneal flap down.  I took great care not to injure the iliac vessels. Exparel anesthetic was injected 2 finger breadths below and  medial to the anterior superior iliac spine as well as along the left groin prior to placing the mesh. I then obtained a piece of Ethicon UltraPro mesh 3 inch x  6 inch, placed it through the Hasson trocar, half of it covered medial  to the inferior epigastric vessels and half of it lateral to the  inferior epigastric vessels. The defect was well  covered with the mesh. I then secured the mesh to the abdominal wall  using an Ethicon secure strap tack. Tacks were placed superior medially at the corner of the mesh and    on each side of the inferior epigastric vessel and two tacks out laterally. No tacks were placed below the  shelving edge of the inguinal ligament. Pneumoperitoneum was reduced  to 8 mmHg. I then brought the peritoneal flap back up to the abdominal  wall and tacked it to the abdominal wall using 4 tacks. There was no  defect in the peritoneum, and the mesh was well covered. I removed the  Hasson trocar and tied down the previously placed pursestring suture.  The closure was viewed laparoscopically. There was no evidence of  fascial defect. There was no air leak at the umbilicus. There was no  evidence of injury to surrounding structures. Pneumoperitoneum was  released, and the remaining trocars were removed. All skin incisions  were closed with a 4-0 Monocryl in a subcuticular fashion followed by  application of liquid band exceed. All needle, instrument, and sponge counts  were correct x2. There are no immediate complications. The patient  tolerated the procedure well. The patient was extubated and taken to the  recovery room in stable condition.  Mary SellaEric M. Andrey CampanileWilson, MD, FACS General, Bariatric, & Minimally Invasive Surgery [facility] [facility], [place]

## 2014-06-11 NOTE — H&P (Signed)
Brad GrossRobert Martin is an 34 y.o. male.   Chief Complaint: groin pain HPI: 34 yo WM comes in for lap repair of left inguinal hernia with mesh. I saw him last August for this. He states his hernia has been bothering him more frequently. He is still able to reduce it but it is very uncomfortable when he does so. It will burn when he reduces it. At rest, he has a dull ache/discomfort in his groin. Denies any f/c/n/v/abd pain  34 yo WM referred Dr Brad Martin for left inguinal hernia. He states he was doing a lot of heavy lifting at work Emptying trash cans about 2 months ago when he developed a lot of left groin pain. He describes it as stinging and burning. He also notices a bulge that will come and go. If he puts pressure in area, it will help with discomfort. The discomfort is increased by the end of the day along with the bulge. He also notices that his BMs are now more every other day or daily.   Past Medical History  Diagnosis Date  . Complication of anesthesia 09-06-2012    woke up during ERCP  . Inguinal hernia 05/2014    left    Past Surgical History  Procedure Laterality Date  . Multiple tooth extractions  02/2012  . Eus N/A 09/06/2012    Procedure: UPPER ENDOSCOPIC ULTRASOUND (EUS) LINEAR;  Surgeon: Brad ModenaWilliam Outlaw, MD;  Location: [facility];  Service: Endoscopy;  Laterality: N/A;  . Ercp N/A 09/06/2012    Procedure: ENDOSCOPIC RETROGRADE CHOLANGIOPANCREATOGRAPHY (ERCP);  Surgeon: Brad ModenaWilliam Outlaw, MD;  [facility];  Service: Endoscopy;  Laterality: N/A;  . Cholecystectomy N/A 10/16/2012    Procedure: LAPAROSCOPIC CHOLECYSTECTOMY WITH INTRAOPERATIVE CHOLANGIOGRAM;  Surgeon: Brad InaEric M Mills Mitton, MD;  [facility];  Service: General;  Laterality: N/A;    Family History  Problem Relation Age of Onset  . Stroke Mother    Social History:  reports that he has never smoked. He has never used smokeless tobacco. He reports that he does not drink alcohol or use illicit drugs.  Allergies:    Allergies  Allergen Reactions  . Benadryl [Diphenhydramine Hcl] Other (See Comments)    Hallucinations    No prescriptions prior to admission    Results for orders placed or performed during the hospital encounter of 06/11/14 (from the past 48 hour(s))  Hemoglobin     Status: None   Collection Time: 06/11/14  9:50 AM  Result Value Ref Range   Hemoglobin 14.2 13.0 - 17.0 g/dL   No results found.  Review of Systems  Constitutional: Negative for fever, chills and weight loss.  HENT: Negative for nosebleeds.   Eyes: Negative for blurred vision.  Respiratory: Negative for shortness of breath.   Cardiovascular: Negative for chest pain and PND.       Denies DOE  Gastrointestinal: Negative for nausea and vomiting.  Genitourinary: Negative for dysuria and hematuria.  Musculoskeletal: Negative.   Skin: Negative for itching and rash.  Neurological: Negative for dizziness, focal weakness, seizures, loss of consciousness and headaches.  Endo/Heme/Allergies: Does not bruise/bleed easily.  Psychiatric/Behavioral: The patient is not nervous/anxious.     Blood pressure 112/73, pulse 60, temperature 97.5 F (36.4 C), temperature source Oral, resp. rate 20, height 6\' 1"  (1.854 m), weight 238 lb 9.6 oz (108.228 kg), SpO2 95 %. Physical Exam  Vitals reviewed. Constitutional: He is oriented to person, place, and time. He appears well-developed and well-nourished. No distress.  HENT:  Head: Normocephalic  and atraumatic.  Right Ear: External ear normal.  Left Ear: External ear normal.  Eyes: Conjunctivae are normal. No scleral icterus.  Neck: No tracheal deviation present.  Cardiovascular: Normal rate.   Respiratory: Effort normal and breath sounds normal. No stridor. No respiratory distress. He has no wheezes.  GI: Soft. He exhibits no distension. There is no tenderness. There is no rebound. A hernia is present. Hernia confirmed positive in the left inguinal area. Hernia confirmed negative  in the right inguinal area.  Genitourinary: Penis normal.     Musculoskeletal: He exhibits no edema or tenderness.  Neurological: He is alert and oriented to person, place, and time. He exhibits normal muscle tone.  Skin: Skin is warm and dry. No rash noted. He is not diaphoretic. No erythema. No pallor.  Psychiatric: He has a normal mood and affect. His behavior is normal. Judgment and thought content normal.     Assessment/Plan Left inguinal hernia  To OR for lap left inguinal hernia repair with mesh. All questions asked and answered.   [name]. Andrey Campanile, MD, FACS General, Bariatric, & Minimally Invasive Surgery [facility], [place]   [facility] M 06/11/2014, 12:35 PM

## 2014-06-11 NOTE — Anesthesia Procedure Notes (Signed)
Procedure Name: Intubation Date/Time: 06/11/2014 1:21 PM Performed by: Jarvis NewcomerARMISTEAD, Nixon Sparr A Pre-anesthesia Checklist: Patient identified, Emergency Drugs available, Suction available, Patient being monitored and Timeout performed Patient Re-evaluated:Patient Re-evaluated prior to inductionOxygen Delivery Method: Circle system utilized Preoxygenation: Pre-oxygenation with 100% oxygen Intubation Type: IV induction Ventilation: Mask ventilation without difficulty and Oral airway inserted - appropriate to patient size Laryngoscope Size: Mac and 4 Grade View: Grade I Tube type: Oral Tube size: 7.5 mm Number of attempts: 1 Airway Equipment and Method: Stylet Placement Confirmation: ETT inserted through vocal cords under direct vision,  positive ETCO2 and breath sounds checked- equal and bilateral Secured at: 22 cm Tube secured with: Tape Dental Injury: Teeth and Oropharynx as per pre-operative assessment

## 2014-06-11 NOTE — Anesthesia Preprocedure Evaluation (Addendum)
Anesthesia Evaluation  Patient identified by MRN, date of birth, ID band Patient awake    Reviewed: Allergy & Precautions, NPO status , Patient's Chart, lab work & pertinent test results  Airway Mallampati: II  TM Distance: >3 FB Neck ROM: Full    Dental no notable dental hx.    Pulmonary neg pulmonary ROS,  breath sounds clear to auscultation  Pulmonary exam normal       Cardiovascular negative cardio ROS  Rhythm:Regular Rate:Normal     Neuro/Psych negative neurological ROS  negative psych ROS   GI/Hepatic negative GI ROS, Neg liver ROS,   Endo/Other  negative endocrine ROS  Renal/GU negative Renal ROS  negative genitourinary   Musculoskeletal negative musculoskeletal ROS (+)   Abdominal   Peds negative pediatric ROS (+)  Hematology negative hematology ROS (+)   Anesthesia Other Findings   Reproductive/Obstetrics negative OB ROS                             Anesthesia Physical Anesthesia Plan  ASA: I  Anesthesia Plan: General   Post-op Pain Management:    Induction: Intravenous  Airway Management Planned: Oral ETT  Additional Equipment:   Intra-op Plan:   Post-operative Plan: Extubation in OR  Informed Consent: I have reviewed the patients History and Physical, chart, labs and discussed the procedure including the risks, benefits and alternatives for the proposed anesthesia with the patient or authorized representative who has indicated his/her understanding and acceptance.   Dental advisory given  Plan Discussed with: CRNA  Anesthesia Plan Comments:         Anesthesia Quick Evaluation  

## 2014-06-11 NOTE — Transfer of Care (Signed)
Immediate Anesthesia Transfer of Care Note  Patient: Brad Martin  Procedure(s) Performed: Procedure(s): LAPAROSCOPIC INGUINAL HERNIA (Left) INSERTION OF MESH (Left)  Patient Location: PACU  Anesthesia Type:General  Level of Consciousness: awake, alert , oriented and patient cooperative  Airway & Oxygen Therapy: Patient Spontanous Breathing and Patient connected to face mask oxygen  Post-op Assessment: Report given to RN, Post -op Vital signs reviewed and stable and Patient moving all extremities X 4  Post vital signs: stable  Last Vitals:  Filed Vitals:   06/11/14 1520  BP: 134/69  Pulse: 88  Temp: 36.7 C  Resp: 12    Complications: No apparent anesthesia complications

## 2014-06-11 NOTE — Discharge Instructions (Signed)
[facility]  UMBILICAL OR INGUINAL HERNIA REPAIR: POST OP INSTRUCTIONS  Always review your discharge instruction sheet given to you by the facility where your surgery was performed. IF YOU HAVE DISABILITY OR FAMILY LEAVE FORMS, YOU MUST BRING THEM TO THE OFFICE FOR PROCESSING.   DO NOT GIVE THEM TO YOUR DOCTOR.  1. A  prescription for pain medication may be given to you upon discharge.  Take your pain medication as prescribed, if needed.  If narcotic pain medicine is not needed, then you may take acetaminophen (Tylenol) or ibuprofen (Advil) as needed. 2. Take your usually prescribed medications unless otherwise directed. 3. If you need a refill on your pain medication, please contact your pharmacy.  They will contact our office to request authorization. Prescriptions will not be filled after 5 pm or on week-ends. 4. You should follow a light diet the first 24 hours after arrival home, such as soup and crackers, etc.  Be sure to include lots of fluids daily.  Resume your normal diet the day after surgery. 5. Most patients will experience some swelling and bruising around the umbilicus or in the groin and scrotum.  Ice packs and reclining will help.  Swelling and bruising can take several days to resolve.  6. It is common to experience some constipation if taking pain medication after surgery.  Increasing fluid intake and taking a stool softener (such as Colace) will usually help or prevent this problem from occurring.  A mild laxative (Milk of Magnesia or Miralax) should be taken according to package directions if there are no bowel movements after 48 hours. 7.   If your surgeon used skin glue on the incision, you may shower in 24 hours.  The glue will flake off over the next 2-3 weeks.  Any sutures or staples will be removed at the office during your follow-up visit. 8. ACTIVITIES:  You may resume regular (light) daily activities beginning the next day--such as daily self-care,  walking, climbing stairs--gradually increasing activities as tolerated.  You may have sexual intercourse when it is comfortable.  Refrain from any heavy lifting or straining until approved by your doctor. a. You may drive when you are no longer taking prescription pain medication, you can comfortably wear a seatbelt, and you can safely maneuver your car and apply brakes. b. RETURN TO WORK: 2 weeks WITH RESTRICTIONS or 6 weeks WITH NO restrictions 9. You should see your doctor in the office for a follow-up appointment approximately 2-3 weeks after your surgery.  Make sure that you call for this appointment within a day or two after you arrive home to insure a convenient appointment time. 10. OTHER INSTRUCTIONS: DO NOT LIFT, PUSH, OR PULL ANYTHING GREATER THAN 15 POUNDS FOR 6 WEEKS    WHEN TO CALL YOUR DOCTOR: 1. Fever over 101.0 2. Inability to urinate 3. Nausea and/or vomiting 4. Extreme swelling or bruising 5. Continued bleeding from incision. 6. Increased pain, redness, or drainage from the incision  The clinic staff is available to answer your questions during regular business hours.  Please dont hesitate to call and ask to speak to one of the nurses for clinical concerns.  If you have a medical emergency, go to the nearest emergency room or call 911.  A surgeon from [facility] is always on call at the hospital   [address], [address], [place], [place]  [id number] ?  [address] [address], [place], [place]   [id number] [phone] ? [phone] ?  FAX [phone] Web site: [email or web address]  Post Anesthesia Home Care Instructions  Activity: Get plenty of rest for the remainder of the day. A responsible adult should stay with you for 24 hours following the procedure.  For the next 24 hours, DO NOT: -Drive a car -Advertising copywriter -Drink alcoholic beverages -Take any medication unless instructed by your physician -Make any legal decisions or sign  important papers.  Meals: Start with liquid foods such as gelatin or soup. Progress to regular foods as tolerated. Avoid greasy, spicy, heavy foods. If nausea and/or vomiting occur, drink only clear liquids until the nausea and/or vomiting subsides. Call your physician if vomiting continues.  Special Instructions/Symptoms: Your throat may feel dry or sore from the anesthesia or the breathing tube placed in your throat during surgery. If this causes discomfort, gargle with warm salt water. The discomfort should disappear within 24 hours.

## 2014-06-12 ENCOUNTER — Encounter (HOSPITAL_COMMUNITY): Payer: Self-pay | Admitting: General Surgery

## 2014-06-12 NOTE — Anesthesia Postprocedure Evaluation (Signed)
  Anesthesia Post-op Note  Patient: Brad MaduroRobert Martin  Procedure(s) Performed: Procedure(s) (LRB): LAPAROSCOPIC INGUINAL HERNIA (Left) INSERTION OF MESH (Left)  Patient Location: PACU  Anesthesia Type: General  Level of Consciousness: awake and alert   Airway and Oxygen Therapy: Patient Spontanous Breathing  Post-op Pain: mild  Post-op Assessment: Post-op Vital signs reviewed, Patient's Cardiovascular Status Stable, Respiratory Function Stable, Patent Airway and No signs of Nausea or vomiting  Last Vitals:  Filed Vitals:   06/11/14 1729  BP: 120/73  Pulse: 71  Temp:   Resp: 16    Post-op Vital Signs: stable   Complications: No apparent anesthesia complications
